# Patient Record
Sex: Female | Born: 1960 | Race: White | Hispanic: No | Marital: Married | Smoking: Former smoker
Health system: Southern US, Community
[De-identification: ages and names within clinical notes are randomized; demographics above are authoritative.]

## PROBLEM LIST (undated history)

## (undated) DIAGNOSIS — J45909 Unspecified asthma, uncomplicated: Secondary | ICD-10-CM

## (undated) DIAGNOSIS — J302 Other seasonal allergic rhinitis: Secondary | ICD-10-CM

## (undated) DIAGNOSIS — E079 Disorder of thyroid, unspecified: Secondary | ICD-10-CM

## (undated) DIAGNOSIS — T7840XA Allergy, unspecified, initial encounter: Secondary | ICD-10-CM

## (undated) DIAGNOSIS — M1990 Unspecified osteoarthritis, unspecified site: Secondary | ICD-10-CM

## (undated) HISTORY — DX: Unspecified asthma, uncomplicated: J45.909

## (undated) HISTORY — DX: Other seasonal allergic rhinitis: J30.2

## (undated) HISTORY — DX: Disorder of thyroid, unspecified: E07.9

## (undated) HISTORY — PX: TUBAL LIGATION: SHX77

## (undated) HISTORY — DX: Allergy, unspecified, initial encounter: T78.40XA

## (undated) HISTORY — DX: Unspecified osteoarthritis, unspecified site: M19.90

---
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
## 1984-03-03 DIAGNOSIS — Z5189 Encounter for other specified aftercare: Secondary | ICD-10-CM

## 1984-03-03 HISTORY — DX: Encounter for other specified aftercare: Z51.89

## 2013-11-11 ENCOUNTER — Ambulatory Visit: Payer: Self-pay | Admitting: Nurse Practitioner

## 2014-05-16 ENCOUNTER — Ambulatory Visit: Payer: Self-pay | Admitting: Nurse Practitioner

## 2014-05-17 ENCOUNTER — Ambulatory Visit: Payer: Self-pay | Admitting: Nurse Practitioner

## 2014-05-17 HISTORY — PX: BREAST CYST ASPIRATION: SHX578

## 2014-07-26 ENCOUNTER — Ambulatory Visit: Payer: Managed Care, Other (non HMO) | Attending: Nurse Practitioner | Admitting: Physical Therapy

## 2014-07-26 ENCOUNTER — Encounter: Payer: Self-pay | Admitting: Physical Therapy

## 2014-07-26 DIAGNOSIS — M791 Myalgia, unspecified site: Secondary | ICD-10-CM

## 2014-07-26 DIAGNOSIS — R278 Other lack of coordination: Secondary | ICD-10-CM | POA: Insufficient documentation

## 2014-07-26 DIAGNOSIS — S39001S Unspecified injury of muscle, fascia and tendon of abdomen, sequela: Secondary | ICD-10-CM | POA: Insufficient documentation

## 2014-07-26 DIAGNOSIS — X58XXXS Exposure to other specified factors, sequela: Secondary | ICD-10-CM | POA: Diagnosis not present

## 2014-07-26 NOTE — Therapy (Signed)
[place] MAIN [facility] SERVICES [address] [place], [place], [address] Phone: [phone]   Fax:  [phone]  Physical Therapy Evaluation  Patient Details  Name: Shalayah Beagley MRN: [id number] Date of Birth: February 18, 1961 Referring Provider:  Josie Saunders, NP  Encounter Date: 07/26/2014      PT End of Session - 07/26/14 1010    Visit Number 1   Number of Visits 12   Date for PT Re-Evaluation 10/19/14   PT Start Time 1010   PT Stop Time 1116   PT Time Calculation (min) 66 min   Activity Tolerance Patient tolerated treatment well;No increased pain   Behavior During Therapy [facility] for tasks assessed/performed      Past Medical History  Diagnosis Date  . Allergy     seasonal (spring and fall)     Past Surgical History  Procedure Laterality Date  . Cesarean section      two 1986, 1992  . "skin spot" removed on scalp -unknown status of cancerous cells  1970  . Tubal ligation      occured with last C-section 1992    There were no vitals filed for this visit.  Visit Diagnosis:  Unspecified injury of muscle, fascia and tendon of abdomen, sequela  Abnormal coordination  Myalgia      Subjective Assessment - 07/26/14 1020    Subjective 1) SUI since 54 yo ago. Worst with coughing, sneezing. Denied with jumping, nor wearing pads. 2) dyspareunia with penetration   Pertinent History Hx of 2 C-section 1986 (pre-eclampsia), 1992 (12 hours of labor). 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 Chiropractic adjustments helped but PT did not. Pt does not do these exercises currently and only does them if she feels a twinge.             [facility] PT Assessment - 07/26/14 1037    Assessment   Medical Diagnosis SUI   Precautions   Precautions None   Restrictions   Weight Bearing Restrictions No   Balance Screen   Has the patient fallen in the past 6 months No   [place]  residence   Home Layout Two level   Prior Function   Level of Independence Independent   Observation/Other Assessments   Other Surveys  Other Surveys  29.2% UDI-6    Functional Tests   Functional tests Other  single leg lift (fitness routine). w/. pelvic instability   Other:   Other/ Comments mild lumbopelvic stability w/ education to coordinate exhalation   Other:   Other/Comments diaphragmatic breathing noted but lacked rib flare    AROM   Overall AROM  Within functional limits for tasks performed;Other (comment)  increased T-spine tension musculature, restricted rotation   Overall AROM Comments PROM hip IR in supine 90/90 position: L 25 deg, R 20 deg   Palpation   SI assessment  noted R AIIS more abteruir > L. Will assess for sacral torsion at next session   Palpation comment severe scar immobility longitudinal and horizontal scar on LQ of abdomen.   adipose crease noted in LQ abdomen   Bed Mobility   Bed Mobility --  noted log rolling                  Pelvic Floor Special Questions - 07/26/14 1124    Perineal Body/Introitus  Normal   Pelvic Floor Internal Exam pt consented    Exam  Type Vaginal   Strength Flicker  cue for stop gas=2/5 posterior> ant, cue for exhalation=3/5   Tone Increased mm tension 2nd layer bil, tenderness on R > L obt int                  PT Education - 07/26/14 1128    Education provided Yes   Education Details HEP, POC, anatomy/ physiology, progression of exercise, modification of fitness routine to  minimize injuries   Person(s) Educated Patient   Methods Explanation;Demonstration;Tactile cues;Verbal cues;Handout   Comprehension Verbalized understanding;Returned demonstration;Verbal cues required          PT Short Term Goals - 07/26/14 1138    PT SHORT TERM GOAL #1   Title Pt will demo increased hip IR on R from 20 deg to 25 deg in order to achieve sexual positions.    Time 6   Period Weeks   Status New   PT SHORT  TERM GOAL #2   Title Pt will demo improved pelvic floor grade from 3/5 to 4/5 in order to advance to deep core strengthening to minimize load on pelvic floor during fitness routine.    Time 6   Period Weeks   Status New           PT Long Term Goals - 07/26/14 1150    PT LONG TERM GOAL #1   Title Pt will demo increased scar mobility in LQ of abdomen in order to improve dee cpre mm function for decreasing leakage and pain with intercourse.    Time 12   Period Weeks   Status New   PT LONG TERM GOAL #2   Title Pt will report decreased pain with intercourse by 75% across two occasions in order to improve QOL.    Time 12   Period Weeks   Status New   PT LONG TERM GOAL #3   Title Pt will report a decrease in UDI-6 from 29% to < 19% in order to demo improve urogenital function.    Time 12   Period Weeks   Status New               Plan - 07/26/14 1130    Clinical Impression Statement Pt is a 54 yo female who c/o SUI and dyspareunia.    Pt will benefit from skilled therapeutic intervention in order to improve on the following deficits Decreased endurance;Hypomobility;Decreased scar mobility;Pain;Increased fascial restricitons;Decreased strength;Decreased mobility;Increased muscle spasms;Decreased coordination;Postural dysfunction;Improper body mechanics;Decreased range of motion;Decreased activity tolerance   Rehab Potential Good   Clinical Impairments Affecting Rehab Potential limited mobility of abdominal scars, limited hip ROM, increased mm tensions in pelvic floor and posterior back, pelvic obliquities,  downward forces of pelvic floor applied during fitnesss routine,  Hx of multiple births and abdominal surgical scars   PT Frequency 1x / week   PT Duration 12 weeks   PT Treatment/Interventions ADLs/Self Care Home Management;Neuromuscular re-education;Scar mobilization;Aquatic Therapy;Biofeedback;Cryotherapy;Therapeutic exercise;Moist Heat;Manual techniques;Therapeutic  activities;Functional mobility training;Dry needling;Gait training;Patient/family education   PT Next Visit Plan scar/abdominal massage, spinal assessment   Consulted and Agree with Plan of Care Patient         Problem List There are no active problems to display for this patient.   Jerl Mina ,PT, DPT, E-RYT  07/26/2014, 12:00 PM  [place] MAIN [facility] SERVICES [address] [place], [place], [address] Phone: [phone]   Fax:  [phone]

## 2014-07-26 NOTE — Patient Instructions (Addendum)
Scar massage horizontal scar with proper hand hold technique (gliding) when laying down Single leg lift modification in fitness classes with exhale  Cough with pelvic floor contraction Reverse kegel with breathing awareness  (Handout)

## 2014-08-02 ENCOUNTER — Ambulatory Visit: Payer: Managed Care, Other (non HMO) | Attending: Nurse Practitioner | Admitting: Physical Therapy

## 2014-08-02 DIAGNOSIS — M791 Myalgia, unspecified site: Secondary | ICD-10-CM

## 2014-08-02 DIAGNOSIS — S39001S Unspecified injury of muscle, fascia and tendon of abdomen, sequela: Secondary | ICD-10-CM | POA: Insufficient documentation

## 2014-08-02 DIAGNOSIS — R278 Other lack of coordination: Secondary | ICD-10-CM | POA: Insufficient documentation

## 2014-08-02 DIAGNOSIS — X58XXXS Exposure to other specified factors, sequela: Secondary | ICD-10-CM | POA: Insufficient documentation

## 2014-08-02 NOTE — Patient Instructions (Addendum)
__Handout with: A.customized modifications in fitness routines (sketched out; 3 way childs pose, quadriped plank on elbows, deep core marching, low cobra) (See treatment section) Cues provided for increased t-spine extension, deep core engaged B. Standing/ double kneeling posture with pelvic neutral and thorax over pelvis.  C. Abductor pollicis longus stretch

## 2014-08-02 NOTE — Therapy (Signed)
[place] MAIN [facility] SERVICES [address] [place], [place], [address] Phone: [phone]   Fax:  [phone]  Physical Therapy Treatment  Patient Details  Name: Jenna Baldwin MRN: [id number] Date of Birth: October 27, 1960 Referring Provider:  Josie Saunders, NP  Encounter Date: 08/02/2014      PT End of Session - 08/02/14 1156    Visit Number 2   Number of Visits 12   Date for PT Re-Evaluation 10/19/14   PT Start Time 1007   PT Stop Time 1110   PT Time Calculation (min) 63 min   Activity Tolerance Patient tolerated treatment well;No increased pain   Behavior During Therapy [facility] for tasks assessed/performed      Past Medical History  Diagnosis Date  . Allergy     seasonal (spring and fall)     Past Surgical History  Procedure Laterality Date  . Cesarean section      two 1986, 1992  . "skin spot" removed on scalp -unknown status of cancerous cells  1970  . Tubal ligation      occured with last C-section 1992    There were no vitals filed for this visit.  Visit Diagnosis:  Unspecified injury of muscle, fascia and tendon of abdomen, sequela  Abnormal coordination  Myalgia      Subjective Assessment - 08/02/14 1010    Subjective Pt has reported she is engaging in her pelvic floor with exhalation during gym exercises. Pt reported inability to perform plank on palms due to pain with thumb after working on [employer].   Pertinent History Hx of 2 C-section 1986 (pre-eclampsia), 1992 (12 hours of labor). 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 Chiropractic adjustments helped but PT does not. Pt does not do these exercises currently and only does them if she feels a twinge.                          [facility] Adult PT Treatment/Exercise - 08/02/14 1036    Posture/Postural Control   Posture/Postural Control Postural limitations   Postural Limitations Increased thoracic kyphosis;Anterior  pelvic tilt   Posture Comments tactile/verbal cues in double kneeling, standing   Pt able to realign thorax/pelvis w/ pelvic breathing applied   Exercises   Exercises Lumbar   Lumbar Exercises: Supine   Other Supine Lumbar Exercises Noted increased extensor mm compensation w/ double leg lifts   modified to deep core marching, cue for lumbopelvic stabili    Lumbar Exercises: Prone   Other Prone Lumbar Exercises excessive lumbar lordosis with self-selected high cobra, upper trap overuse  modified to low cobra   Other Prone Lumbar Exercises modified childs pose -3 way  optimizeparaspinal &  intercostals   Lumbar Exercises: Quadruped   Plank noted excessive lumbar lordosis   modified onto elbows w/ improved form   Modalities   Modalities Moist Heat  5 min, skin intact post-Tx   Manual Therapy   Manual Therapy Joint mobilization;Soft tissue mobilization;Scapular mobilization  Noted increased bony and soft tissue mobility post-Tx   Manual therapy comments Noted increased hypomobility at T3-L1, mm tension on paraspinals bil, medial scapular mm. Noted wrist ext limited by trigger point @ abductor pollicis (limiting ability for hand position in plank)   R sacral torsion, tenderness on PSIS L.   Joint Mobilization Grade III PA on SP T3-L1 (T3, 10 > others), Grade I costovertebral joints   Long axis  distraction on R, PA mobs on R lat border.    Soft tissue mobilization STM paraspinals, trigger point release on Abductor pollicis longus    Scapular Mobilization myofascial glide medial scap L                 PT Education - 08/02/14 1155    Education provided Yes   Education Details HEP, anatomy with visual images of muscles, principle of alignment, importance of modifications in fitness class routine to preserve longterm function of pelvic floor   Person(s) Educated Patient   Methods Explanation;Demonstration;Tactile cues;Verbal cues;Handout   Comprehension Verbalized  understanding;Returned demonstration          PT Short Term Goals - 07/26/14 1138    PT SHORT TERM GOAL #1   Title Pt will demo increased hip IR on R from 20 deg to 25 deg in order to achieve sexual positions.    Time 6   Period Weeks   Status New   PT SHORT TERM GOAL #2   Title Pt will demo improved pelvic floor grade from 3/5 to 4/5 in order to advance to deep core strengthening to minimize load on pelvic floor during fitness routine.    Time 6   Period Weeks   Status New           PT Long Term Goals - 07/26/14 1150    PT LONG TERM GOAL #1   Title Pt will demo increased scar mobility in LQ of abdomen in order to improve dee cpre mm function for decreasing leakage and pain with intercourse.    Time 12   Period Weeks   Status New   PT LONG TERM GOAL #2   Title Pt will report decreased pain with intercourse by 75% across two occasions in order to improve QOL.    Time 12   Period Weeks   Status New   PT LONG TERM GOAL #3   Title Pt will report a decrease in UDI-6 from 29% to < 19% in order to demo improve urogenital function.    Time 12   Period Weeks   Status New               Plan - 08/02/14 1157    Clinical Impression Statement Suspect pt's right-sided pelvic floor mm tension evident in last session may be due to R sacral torsion and improper form with fitness exercises.  Addressed pt's pelvic obliquities and increased T-spine mm tensions/ hypomobility today with manual Tx and modifications to her fitness routine. Pt gained awareness of breathing to pelvic floor mm and ways to minimize thoracic load in fitness exercises  w/ neuro-reedu and  postural re-education . Pt continues to b enefit from skilled PT.    Pt will benefit from skilled therapeutic intervention in order to improve on the following deficits Decreased endurance;Hypomobility;Decreased scar mobility;Pain;Increased fascial restricitons;Decreased strength;Decreased mobility;Increased muscle  spasms;Decreased coordination;Postural dysfunction;Improper body mechanics;Decreased range of motion;Decreased activity tolerance   Rehab Potential Good   Clinical Impairments Affecting Rehab Potential limited mobility of abdominal scars, limited hip ROM, increased mm tensions in pelvic floor and posterior back, pelvic obliquities,  downward forces of pelvic floor applied during fitnesss routine,  Hx of multiple births and abdominal surgerical scars   PT Frequency 1x / week   PT Duration 12 weeks   PT Treatment/Interventions ADLs/Self Care Home Management;Neuromuscular re-education;Scar mobilization;Aquatic Therapy;Biofeedback;Cryotherapy;Therapeutic exercise;Moist Heat;Manual techniques;Therapeutic activities;Functional mobility training;Dry needling;Gait training;Patient/family education   PT Next Visit Plan scar/abdominal massage  Consulted and Agree with Plan of Care Patient        Problem List There are no active problems to display for this patient.   Jerl Mina ,PT, DPT, E-RYT  08/02/2014, 12:04 PM  [place] MAIN [facility] SERVICES [address] [place], [place], [address] Phone: [phone]   Fax:  [phone]

## 2014-08-08 ENCOUNTER — Encounter: Payer: Managed Care, Other (non HMO) | Admitting: Physical Therapy

## 2014-08-16 ENCOUNTER — Ambulatory Visit: Payer: Managed Care, Other (non HMO) | Admitting: Physical Therapy

## 2014-08-16 DIAGNOSIS — M791 Myalgia, unspecified site: Secondary | ICD-10-CM

## 2014-08-16 DIAGNOSIS — S39001S Unspecified injury of muscle, fascia and tendon of abdomen, sequela: Secondary | ICD-10-CM

## 2014-08-16 DIAGNOSIS — R278 Other lack of coordination: Secondary | ICD-10-CM

## 2014-08-16 NOTE — Patient Instructions (Signed)
You are now ready to begin training the deep core muscles system: diaphragm, transverse abdominis, pelvic floor . These muscles must work together as a team.           The key to these exercises to train the brain to coordinate the timing of these muscles and to have them turn on for long periods of time to hold you upright against gravity (especially important if you are on your feet all day).These muscles are postural muscles and play a role stabilizing your spine and bodyweight. By doing these repetitions slowly and correctly instead of doing crunches, you will achieve a flatter belly without a lower pooch. You are also placing your spine in a more neutral position and breathing properly which in turn, decreases your risk for problems related to your pelvic floor, abdominal, and low back such as pelvic organ prolapse, hernias, diastasis recti (separation of superficial muscles), disk herniations, spinal fractures. These exercises set a solid foundation for you to later progress to resistance/ strength training with therabands and weights and return to other typical fitness exercises with a stronger deeper core.     Perform only Level 1-2 this week.  Added Quick Pelvic floor contraction Level 1     Scar massage this week  Prone on elbow and low cobra refinement cues

## 2014-08-16 NOTE — Therapy (Signed)
[place] MAIN [facility] SERVICES [address] [place], [place], [address] Phone: [phone]   Fax:  [phone]  Physical Therapy Treatment  Patient Details  Name: Jenna Baldwin MRN: [id number] Date of Birth: 12-07-1960 Referring Provider:  Josie Saunders, NP  Encounter Date: 08/16/2014      PT End of Session - 08/16/14 1119    Visit Number 3   Number of Visits 12   Date for PT Re-Evaluation 10/19/14   PT Start Time 1005   PT Stop Time 1055   PT Time Calculation (min) 50 min   Activity Tolerance Patient tolerated treatment well;No increased pain   Behavior During Therapy [facility] for tasks assessed/performed      Past Medical History  Diagnosis Date  . Allergy     seasonal (spring and fall)     Past Surgical History  Procedure Laterality Date  . Cesarean section      two 1986, 1992  . "skin spot" removed on scalp -unknown status of cancerous cells  1970  . Tubal ligation      occured with last C-section 1992    There were no vitals filed for this visit.  Visit Diagnosis:  Unspecified injury of muscle, fascia and tendon of abdomen, sequela  Abnormal coordination  Myalgia      Subjective Assessment - 08/16/14 1014    Subjective Pt has been using the modifications to gym exercises and HEP. Pt continues to modify her other exercises due to her R thumb pain. Pt did not perform her scar massage this past week.    Pertinent History Hx of 2 C-section 1986 (pre-eclampsia), 1992 (12 hours of labor). 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 Chiropractic adjustments helped but PT does not. Pt does not do these exercises currently and only does them if she feels a twinge.             [facility] PT Assessment - 08/16/14 1112    Assessment   Medical Diagnosis SUI   Precautions   Precautions None   Restrictions   Weight Bearing Restrictions No   Home Environment   Living Environment Private residence   Home Layout Two level   Prior Function   Level of Independence Independent   Observation/Other Assessments   Other Surveys  Other Surveys  29.2% UDI-6    Functional Tests   Functional tests Other  single leg lift (fitness routine). w/. pelvic instability   Other:   Other/ Comments --   Other:   Other/Comments --   AROM   Overall AROM  --  increased T-spine tension musculature, restricted rotation   Overall AROM Comments --   Palpation   SI assessment  --   Palpation comment --  less t-spine mm tensions, spinal mobility PAVM increased    Bed Mobility   Bed Mobility --  noted log rolling                      [facility] Adult PT Treatment/Exercise - 08/16/14 1112    Posture/Postural Control   Posture/Postural Control Postural limitations   Postural Limitations --  able to self correct to neutral in hooklying    Posture Comments hooklying marching w/ extensor, downgraded to Dynamic Stabilization 1-2  added pelvicf loor contraction (quick) Level 1   Exercises   Exercises Other Exercises   Lumbar Exercises: Supine   Other Supine Lumbar Exercises --   Lumbar Exercises:  Prone   Other Prone Lumbar Exercises improved cobra but requried cuing for elbow wrist alignment for increased WBing and T-spine ext not at lumbar    Other Prone Lumbar Exercises --   Lumbar Exercises: Quadruped   Plank improved prone on elbow plank  w/o lumbar lordosis, decreased LE activation  cue/ demo for increased ankle DF and scap downward rotation   Modalities   Modalities Moist Heat  5 min, skin intact post-Tx   Manual Therapy   Manual Therapy Joint mobilization;Soft tissue mobilization;Scapular mobilization  Noted increased bony and soft tissue mobility post-Tx   Manual therapy comments Noted increased hypomobility at T3-L1, mm tension on paraspinals bil, medial scapular mm. Noted wrist ext limited by trigger point @ abductor pollicis (limiting ability for hand position in plank)   R sacral  torsion, tenderness on PSIS L.   Joint Mobilization Grade III PA on SP T3-L1 (T3, 10 > others), Grade I costovertebral joints   Long axis distraction on R, PA mobs on R lat border.    Soft tissue mobilization STM paraspinals, trigger point release on Abductor pollicis longus    Scapular Mobilization myofascial glide medial scap L                 PT Education - 08/16/14 1120    Education provided Yes   Education Details HEP, and educated if thumb does not improve by next week, recommended pt to seek an OT evaluation    Person(s) Educated Patient   Methods Explanation;Demonstration;Tactile cues;Verbal cues;Handout   Comprehension Verbalized understanding;Returned demonstration          PT Short Term Goals - 07/26/14 1138    PT SHORT TERM GOAL #1   Title Pt will demo increased hip IR on R from 20 deg to 25 deg in order to achieve sexual positions.    Time 6   Period Weeks   Status New   PT SHORT TERM GOAL #2   Title Pt will demo improved pelvic floor grade from 3/5 to 4/5 in order to advance to deep core strengthening to minimize load on pelvic floor during fitness routine.    Time 6   Period Weeks   Status New           PT Long Term Goals - 07/26/14 1150    PT LONG TERM GOAL #1   Title Pt will demo increased scar mobility in LQ of abdomen in order to improve dee cpre mm function for decreasing leakage and pain with intercourse.    Time 12   Period Weeks   Status New   PT LONG TERM GOAL #2   Title Pt will report decreased pain with intercourse by 75% across two occasions in order to improve QOL.    Time 12   Period Weeks   Status New   PT LONG TERM GOAL #3   Title Pt will report a decrease in UDI-6 from 29% to < 19% in order to demo improve urogenital function.    Time 12   Period Weeks   Status New               Plan - 08/16/14 1121    Clinical Impression Statement Pt demo'd improved propioception awareness with pelvic/spinal alignment and  modifications to her exercises, decreased midback mm tensions compared to last session. Initiated dynamic stabilization 1-2 because pt continued to show faulty movement patterns with hooklying marching. [employer] quick pelvic floor contraction with Level 1.  Pt will continue  to benefit from skilled PT.    Pt will benefit from skilled therapeutic intervention in order to improve on the following deficits Decreased endurance;Hypomobility;Decreased scar mobility;Pain;Increased fascial restricitons;Decreased strength;Decreased mobility;Increased muscle spasms;Decreased coordination;Postural dysfunction;Improper body mechanics;Decreased range of motion;Decreased activity tolerance   Rehab Potential Good   Clinical Impairments Affecting Rehab Potential limited mobility of abdominal scars, limited hip ROM, increased mm tensions in pelvic floor and posterior back, pelvic obliquities,  downward forces of pelvic floor applied during fitnesss routine,  Hx of multiple births and abdominal surgerical scars   PT Frequency 1x / week   PT Duration 12 weeks   PT Treatment/Interventions ADLs/Self Care Home Management;Neuromuscular re-education;Scar mobilization;Aquatic Therapy;Biofeedback;Cryotherapy;Therapeutic exercise;Moist Heat;Manual techniques;Therapeutic activities;Functional mobility training;Dry needling;Gait training;Patient/family education   PT Next Visit Plan scar/abdominal massage, reassess pelvic floro contraction, and sacral alignment   Consulted and Agree with Plan of Care Patient        Problem List There are no active problems to display for this patient.   Jerl Mina ,PT, DPT, E-RYT  08/16/2014, 11:28 AM  [place] MAIN [facility] SERVICES [address] [place], [place], [address] Phone: [phone]   Fax:  [phone]

## 2014-08-22 ENCOUNTER — Encounter: Payer: Managed Care, Other (non HMO) | Admitting: Physical Therapy

## 2014-08-30 ENCOUNTER — Encounter: Payer: Managed Care, Other (non HMO) | Admitting: Physical Therapy

## 2014-09-13 ENCOUNTER — Encounter: Payer: Managed Care, Other (non HMO) | Admitting: Physical Therapy

## 2015-08-01 ENCOUNTER — Other Ambulatory Visit: Payer: Self-pay | Admitting: Internal Medicine

## 2015-08-01 DIAGNOSIS — Z1231 Encounter for screening mammogram for malignant neoplasm of breast: Secondary | ICD-10-CM

## 2015-08-01 DIAGNOSIS — N6009 Solitary cyst of unspecified breast: Secondary | ICD-10-CM

## 2015-08-13 ENCOUNTER — Ambulatory Visit
Admission: RE | Admit: 2015-08-13 | Discharge: 2015-08-13 | Disposition: A | Discharge status: Home / Self Care | Payer: Managed Care, Other (non HMO) | Source: Ambulatory Visit | Attending: Internal Medicine | Admitting: Internal Medicine

## 2015-08-13 DIAGNOSIS — Z1231 Encounter for screening mammogram for malignant neoplasm of breast: Secondary | ICD-10-CM

## 2015-08-13 DIAGNOSIS — N6009 Solitary cyst of unspecified breast: Secondary | ICD-10-CM

## 2015-08-13 DIAGNOSIS — R928 Other abnormal and inconclusive findings on diagnostic imaging of breast: Secondary | ICD-10-CM | POA: Diagnosis not present

## 2015-08-13 DIAGNOSIS — N6001 Solitary cyst of right breast: Secondary | ICD-10-CM | POA: Diagnosis present

## 2015-11-05 IMAGING — US US BREAST*R* LIMITED INC AXILLA
1 series · 9 of 9 positions shown · non-contrast
Comparison: Priors

CLINICAL DATA: Patient for short-term followup right breast mass.

EXAM:
DIGITAL DIAGNOSTIC RIGHT MAMMOGRAM WITH 3D TOMOSYNTHESIS WITH CAD
ULTRASOUND RIGHT BREAST

[Series 1: us breast*right* limited inc axilla · 0.09mm/px · 9 of 9 slices shown]
[im 1/9]
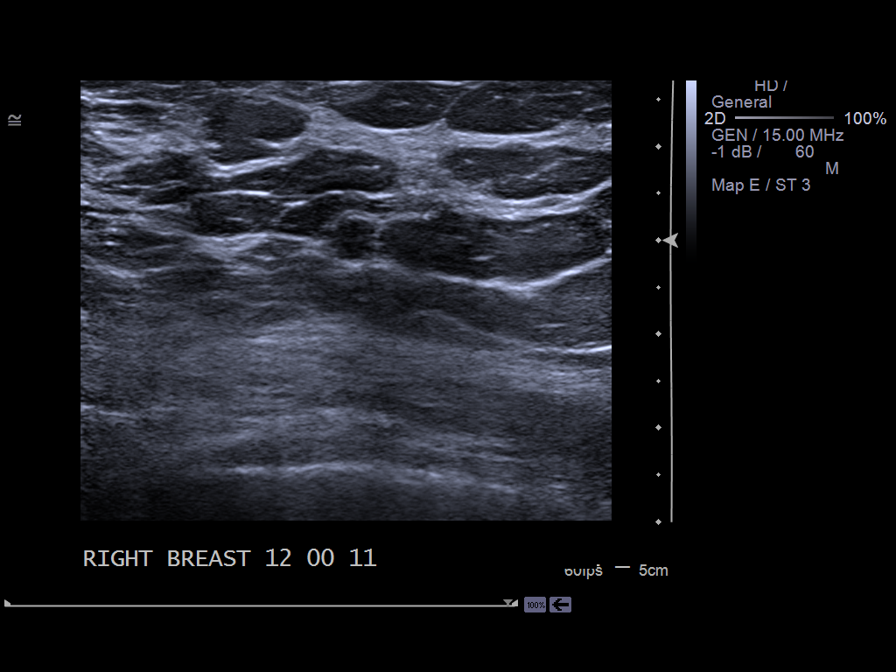
[im 2/9]
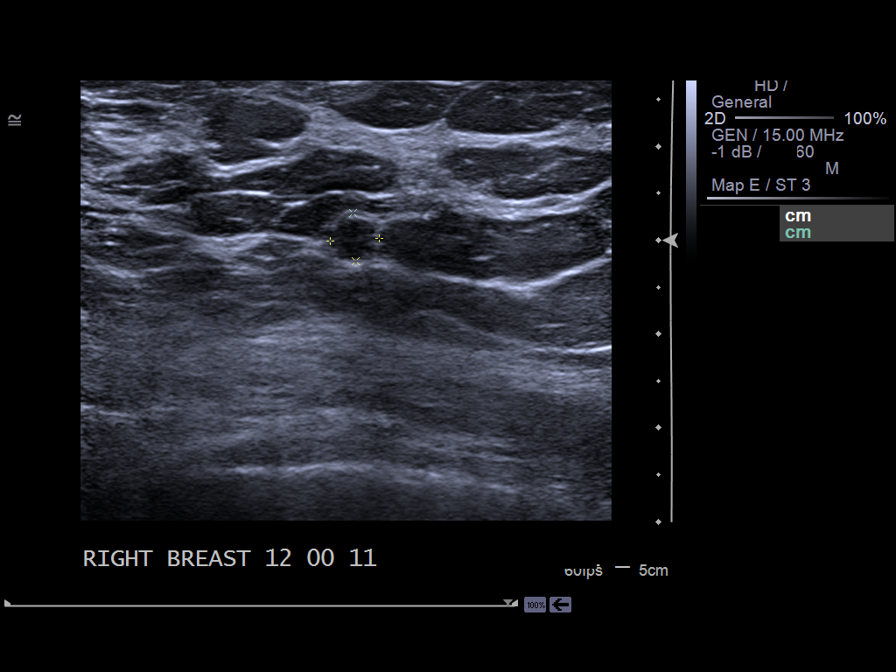
[im 3/9]
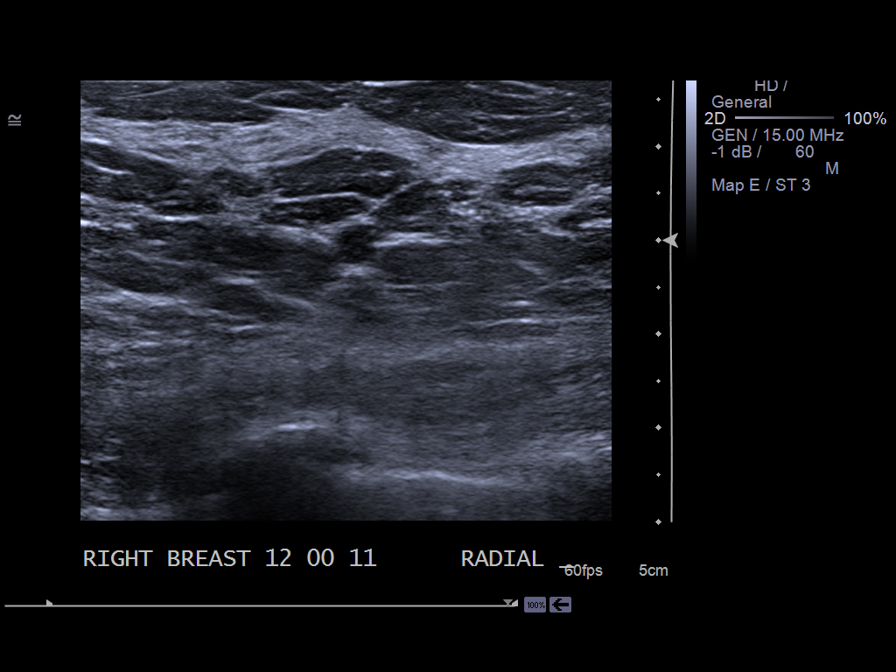
[im 4/9]
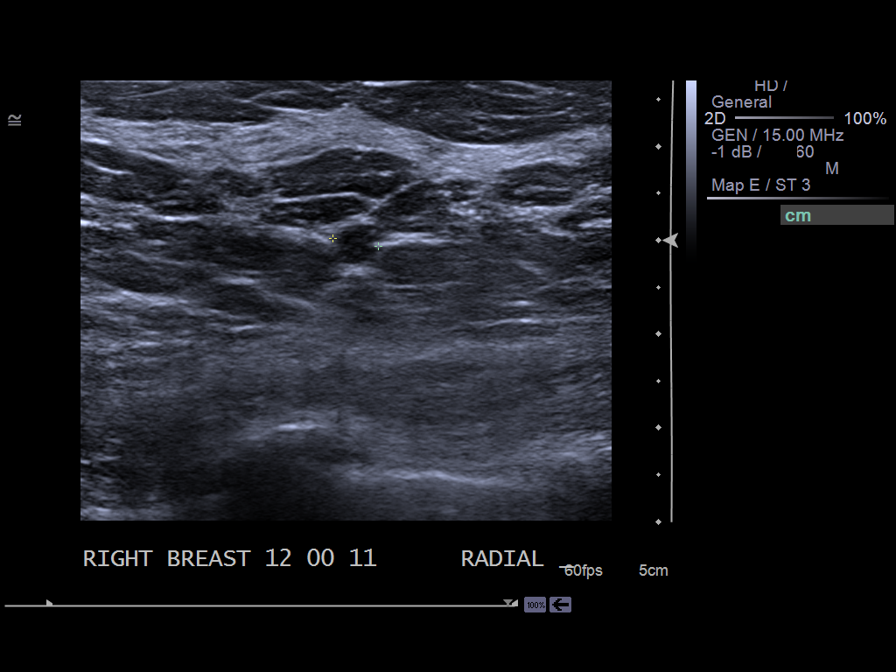
[im 5/9]
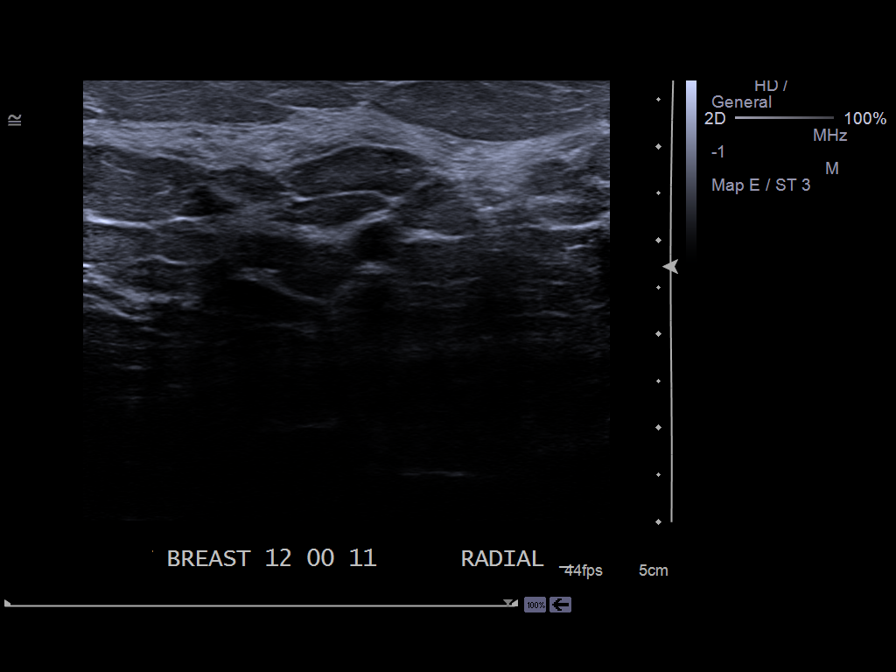
[im 6/9]
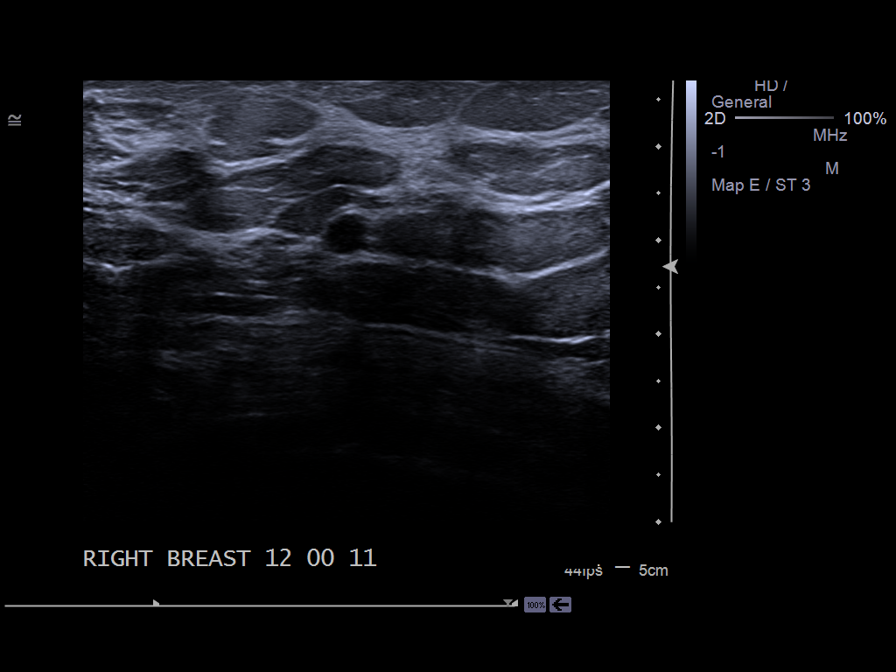
[im 7/9]
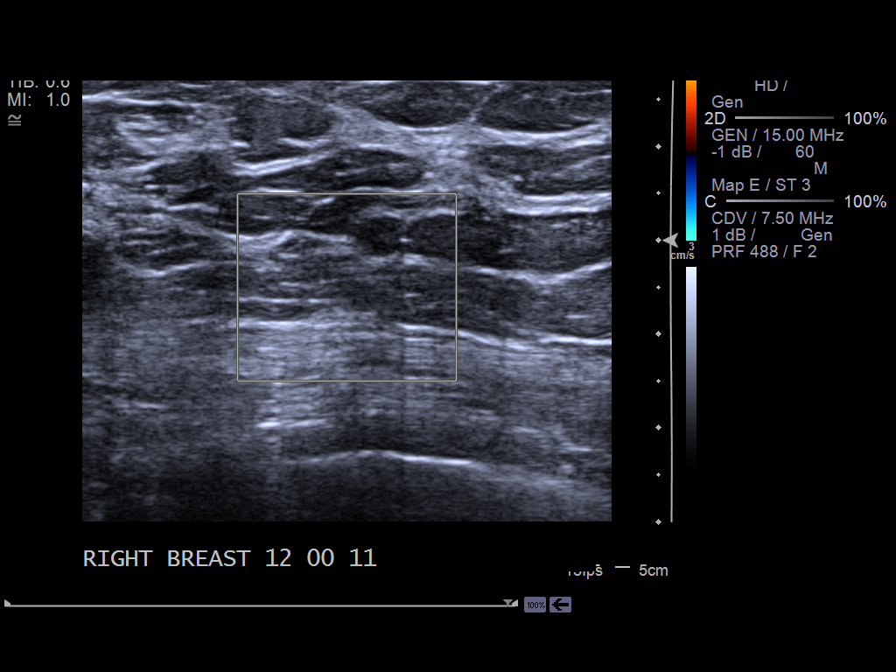
[im 8/9]
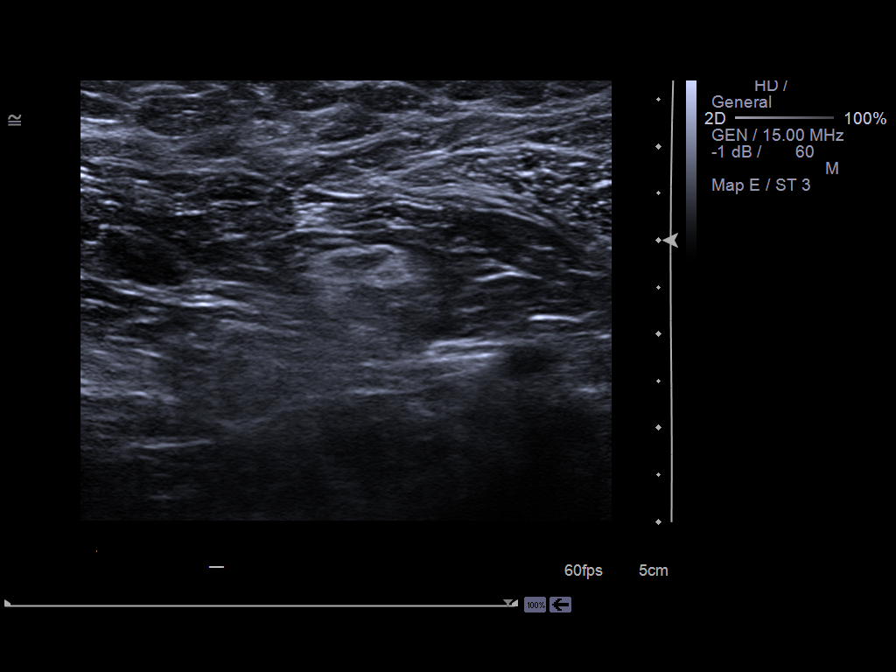
[im 9/9]
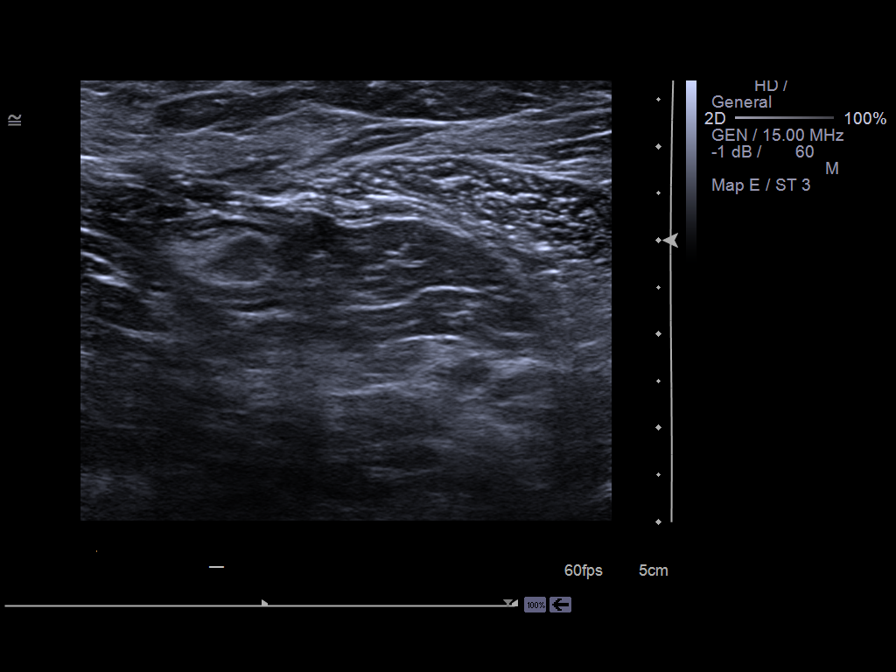

[9 of 9 positions shown; findings below may reference images not displayed]

ACR Breast Density Category c: The breast tissue is heterogeneously
dense, which may obscure small masses.
FINDINGS: Within the 12 o'clock position right breast posterior depth there is
a partially obscured lobular 7 mm oval mass, not significantly
changed in size when compared to prior examination.

Mammographic images were processed with CAD.

On physical exam, I palpate no discrete mass within the 12 o'clock
position right breast.

Targeted ultrasound is performed, showing a microlobular round 5 x 5
x 5 mm hypoechoic mass within the right breast 12 o'clock position
11 cm from the nipple. No right axillary adenopathy.
IMPRESSION: Indeterminate lobular hypoechoic round mass within the right breast
12 o'clock position. This may potentially represent a complicated
cyst however solid mass is not excluded.

RECOMMENDATION:
Attempt at ultrasound-guided aspiration with the potential for
biopsy of indeterminate right breast mass.

This will be scheduled at the patient's convenience.

I have discussed the findings and recommendations with the patient.
Results were also provided in writing at the conclusion of the
visit. If applicable, a reminder letter will be sent to the patient
regarding the next appointment.

BI-RADS CATEGORY  4: Suspicious.

## 2016-09-04 ENCOUNTER — Other Ambulatory Visit: Payer: Self-pay | Admitting: Internal Medicine

## 2016-09-04 DIAGNOSIS — Z1231 Encounter for screening mammogram for malignant neoplasm of breast: Secondary | ICD-10-CM

## 2016-09-25 ENCOUNTER — Ambulatory Visit
Admission: RE | Admit: 2016-09-25 | Discharge: 2016-09-25 | Disposition: A | Discharge status: Home / Self Care | Payer: Commercial Managed Care - PPO | Source: Ambulatory Visit | Attending: Internal Medicine | Admitting: Internal Medicine

## 2016-09-25 ENCOUNTER — Encounter: Payer: Self-pay | Admitting: Radiology

## 2016-09-25 DIAGNOSIS — Z1231 Encounter for screening mammogram for malignant neoplasm of breast: Secondary | ICD-10-CM

## 2016-10-07 ENCOUNTER — Ambulatory Visit: Payer: Managed Care, Other (non HMO) | Admitting: Internal Medicine

## 2016-10-09 ENCOUNTER — Ambulatory Visit: Payer: Managed Care, Other (non HMO) | Admitting: Family Medicine

## 2019-05-27 ENCOUNTER — Ambulatory Visit: Payer: Commercial Managed Care - PPO | Attending: Internal Medicine

## 2019-05-27 DIAGNOSIS — Z23 Encounter for immunization: Secondary | ICD-10-CM

## 2019-05-27 NOTE — Progress Notes (Signed)
   Covid-19 Vaccination Clinic  Name:  Sheritta Fiest    MRN: [id number] DOB: 11-12-1960  05/27/2019  Ms. Pulgarin was observed post Covid-19 immunization for 15 minutes without incident. She was provided with Vaccine Information Sheet and instruction to access the V-Safe system.   Ms. Leino was instructed to call 911 with any severe reactions post vaccine: Marland Kitchen Difficulty breathing  . Swelling of face and throat  . A fast heartbeat  . A bad rash all over body  . Dizziness and weakness   Immunizations Administered    Name Date Dose VIS Date Route   Pfizer COVID-19 Vaccine 05/27/2019  4:09 PM 0.3 mL 02/11/2019 Intramuscular   Manufacturer: [place]   Lot: [id number]   [place]: [id number]

## 2019-07-04 ENCOUNTER — Ambulatory Visit: Payer: Commercial Managed Care - PPO

## 2019-07-06 ENCOUNTER — Ambulatory Visit: Payer: Commercial Managed Care - PPO | Attending: Internal Medicine

## 2019-07-06 DIAGNOSIS — Z23 Encounter for immunization: Secondary | ICD-10-CM

## 2019-07-06 NOTE — Progress Notes (Signed)
   Covid-19 Vaccination Clinic  Name:  Helaine Burfield    MRN: [id number] DOB: [birth year]  07/06/2019  Ms. Menasco was observed post Covid-19 immunization for 15 minutes without incident. She was provided with Vaccine Information Sheet and instruction to access the V-Safe system.   Ms. Helmkamp was instructed to call 911 with any severe reactions post vaccine: Marland Kitchen Difficulty breathing  . Swelling of face and throat  . A fast heartbeat  . A bad rash all over body  . Dizziness and weakness   Immunizations Administered    Name Date Dose VIS Date Route   Pfizer COVID-19 Vaccine 07/06/2019  4:48 PM 0.3 mL 04/27/2018 Intramuscular   Manufacturer: [place]   Lot: [id number]   [place]: [id number]

## 2020-10-01 ENCOUNTER — Encounter: Payer: Self-pay | Admitting: Gastroenterology

## 2020-10-22 ENCOUNTER — Other Ambulatory Visit: Payer: Self-pay

## 2020-10-22 ENCOUNTER — Ambulatory Visit (AMBULATORY_SURGERY_CENTER): Payer: Commercial Managed Care - PPO

## 2020-10-22 VITALS — Ht 62.0 in | Wt 146.0 lb

## 2020-10-22 DIAGNOSIS — Z800 Family history of malignant neoplasm of digestive organs: Secondary | ICD-10-CM

## 2020-10-22 DIAGNOSIS — Z1211 Encounter for screening for malignant neoplasm of colon: Secondary | ICD-10-CM

## 2020-10-22 MED ORDER — PEG-KCL-NACL-NASULF-NA ASC-C 100 G PO SOLR: 1.0000 | Freq: Once | ORAL | 0 refills | Status: AC

## 2020-10-22 NOTE — Progress Notes (Signed)
Pre visit completed via phone call; Patient verified name, DOB, and address; No egg or soy allergy known to patient  No issues with past sedation with any surgeries or procedures Patient denies ever being told they had issues or difficulty with intubation  No FH of Malignant Hyperthermia No diet pills per patient No home 02 use per patient  No blood thinners per patient  Pt denies issues with constipation  No A fib or A flutter  EMMI video via MyChart  COVID 19 guidelines implemented in [facility] today with Pt and RN  Pt is fully vaccinated for Covid x 2;  Coupon given to pt in [facility] today and NO PA's for preps discussed with pt In [facility] today  Discussed with pt there will be an out-of-pocket cost for prep and that varies from $0 to 70 dollars   Due to the COVID-19 pandemic we are asking patients to follow certain guidelines.  Pt aware of COVID protocols and [facility] guidelines

## 2020-10-29 ENCOUNTER — Telehealth: Payer: Self-pay | Admitting: Gastroenterology

## 2020-10-29 NOTE — Telephone Encounter (Signed)
PT HAD A LOT OF QUESTIONS ABOUT HER DIET PRIOR TO HER COLON 9-2- MANY QUESTIONS ANSWERED TODAY   ? CAN SHE HAVE PROBIOTIC- YES CAN SHE HAVE HER THYROID MED PRIOR- YES   [email or web address]   PT WANTS MIRALAX PREP DUE TO SODIUM - SHE HAS MEINERS DX

## 2020-10-29 NOTE — Telephone Encounter (Signed)
Inbound call from patient requesting a call from a nurse please.  Has questions in regards to prep diet.

## 2020-10-29 NOTE — Telephone Encounter (Signed)
New Miralax instructions to pts e-mail - pt to call with questions after she receives these

## 2020-10-30 ENCOUNTER — Encounter: Payer: Self-pay | Admitting: Gastroenterology

## 2020-11-02 ENCOUNTER — Other Ambulatory Visit: Payer: Self-pay

## 2020-11-02 ENCOUNTER — Ambulatory Visit (AMBULATORY_SURGERY_CENTER): Payer: BC Managed Care – PPO | Admitting: Gastroenterology

## 2020-11-02 ENCOUNTER — Encounter: Payer: Self-pay | Admitting: Gastroenterology

## 2020-11-02 VITALS — BP 124/70 | HR 59 | Temp 98.0°F | Resp 14 | Ht 62.0 in | Wt 146.0 lb

## 2020-11-02 DIAGNOSIS — D128 Benign neoplasm of rectum: Secondary | ICD-10-CM

## 2020-11-02 DIAGNOSIS — Z1211 Encounter for screening for malignant neoplasm of colon: Secondary | ICD-10-CM

## 2020-11-02 DIAGNOSIS — Z800 Family history of malignant neoplasm of digestive organs: Secondary | ICD-10-CM | POA: Diagnosis not present

## 2020-11-02 DIAGNOSIS — Z8371 Family history of colonic polyps: Secondary | ICD-10-CM

## 2020-11-02 MED ORDER — SODIUM CHLORIDE 0.9 % IV SOLN: 500.0000 mL | Freq: Once | INTRAVENOUS | Status: DC

## 2020-11-02 NOTE — Progress Notes (Addendum)
Referring Provider: Thornton Park, MD Primary Care Physician:  Hayden Rasmussen, MD  Reason for Consultation:  Colon cancer screening   IMPRESSION:  Need for colon cancer screening  PLAN: Colonoscopy in the [place] today   HPI: Jenna Baldwin is a 60 y.o. female presents for screening colonoscopy.  Colonoscopy at age 32 in [place] that was normal.   No baseline GI symptoms.   Paternal grandfather with colon cancer at an advanced age. Father with colon polyps.    Past Medical History:  Diagnosis Date   Arthritis    RIGHT knee and thumb   Asthma    hx of   Blood transfusion without reported diagnosis 1986   Seasonal allergies    Thyroid disease    on meds    Past Surgical History:  Procedure Laterality Date   "skin spot" removed on scalp -unknown status of cancerous cells  03/03/1968   BREAST CYST ASPIRATION Right 05/17/2014   CESAREAN SECTION     1986, 1992   TUBAL LIGATION     occured with last C-section 1992    Current Outpatient Medications  Medication Sig Dispense Refill   TIROSINT 50 MCG CAPS Take 1 capsule by mouth daily.     Bioflavonoid Products (VITAMIN C/BIOFLAVONOIDS PO) Take 1 capsule by mouth 2 (two) times daily.     Cholecalciferol (BIO-D-MULSION FORTE) 50 MCG/0.04ML LIQD Take 1 drop by mouth daily at 6 (six) AM.     COD LIVER OIL/VITAMINS A & D PO Take 1 Dose by mouth 2 (two) times daily.     COLLAGEN PO Take 1 Dose by mouth daily at 6 (six) AM.     magnesium oxide (MAG-OX) 400 MG tablet Take 1 tablet by mouth at bedtime as needed.     Multiple Vitamins-Minerals (ICAPS AREDS 2 PO) Take 1 capsule by mouth 2 (two) times daily.     Current Facility-Administered Medications  Medication Dose Route Frequency Provider Last Rate Last Admin   0.9 %  sodium chloride infusion  500 mL Intravenous Once Thornton Park, MD        Allergies as of 11/02/2020 - Review Complete 11/02/2020  Allergen Reaction Noted   Gluten meal Diarrhea, Rash, and Other (See  Comments) 10/22/2020   Nitrofurantoin Other (See Comments) 07/13/2013   Fluconazole Hives 07/26/2014   Sulfa antibiotics Diarrhea, Nausea And Vomiting, and Other (See Comments) 07/13/2013   Macrodantin [nitrofurantoin macrocrystal] Other (See Comments) 07/26/2014   Levothyroxine sodium Rash and Other (See Comments) 07/04/2019    Family History  Problem Relation Age of Onset   Cancer Mother    Breast cancer Mother 26   Colon polyps Father 25   Cancer Paternal Grandmother    Colon polyps Paternal Grandfather 88   Colon cancer Paternal Grandfather 52   Cancer Paternal Grandfather    Esophageal cancer Neg Hx    Rectal cancer Neg Hx    Stomach cancer Neg Hx      Physical Exam: General:   Alert,  well-nourished, pleasant and cooperative in NAD Head:  Normocephalic and atraumatic. Eyes:  Sclera clear, no icterus.   Conjunctiva pink. Mouth:  No deformity or lesions.   Neck:  Supple; no masses or thyromegaly. Lungs:  Clear throughout to auscultation.   No wheezes. Heart:  Regular rate and rhythm; no murmurs. Abdomen:  Soft, non-tender, nondistended, normal bowel sounds, no rebound or guarding.  Msk:  Symmetrical. No boney deformities LAD: No inguinal or umbilical LAD Extremities:  No clubbing or edema.  Neurologic:  Alert and  oriented x4;  grossly nonfocal Skin:  No obvious rash or bruise. Psych:  Alert and cooperative. Normal mood and affect.     Studies/Results: No results found.    Jenna Baldwin L. Tarri Glenn, MD, MPH 11/02/2020, 8:23 AM

## 2020-11-02 NOTE — Progress Notes (Signed)
Called to room to assist during endoscopic procedure.  Patient ID and intended procedure confirmed with present staff. Received instructions for my participation in the procedure from the performing physician.  

## 2020-11-02 NOTE — Op Note (Signed)
Jenna Baldwin Patient Name: Jenna Baldwin Procedure Date: 11/02/2020 8:17 AM MRN: [id number] Endoscopist: Thornton Park MD, MD Age: 60 Referring MD:  Date of Birth: 1961/01/20 Gender: Female Account #: [id number] Procedure:                Colonoscopy Indications:              Screening for colorectal malignant neoplasm                           Normal colonoscopy in [place] at age 25                           Paternal grandfather with colon cancer at an                            advanced age                           Father with colon polyp Medicines:                Monitored Anesthesia Care Procedure:                Pre-Anesthesia Assessment:                           - Prior to the procedure, a History and Physical                            was performed, and patient medications and                            allergies were reviewed. The patient's tolerance of                            previous anesthesia was also reviewed. The risks                            and benefits of the procedure and the sedation                            options and risks were discussed with the patient.                            All questions were answered, and informed consent                            was obtained. Prior Anticoagulants: The patient has                            taken no previous anticoagulant or antiplatelet                            agents. ASA Grade Assessment: II - A patient with  mild systemic disease. After reviewing the risks                            and benefits, the patient was deemed in                            satisfactory condition to undergo the procedure.                           After obtaining informed consent, the colonoscope                            was passed under direct vision. Throughout the                            procedure, the patient's blood pressure, pulse, and                            oxygen saturations were  monitored continuously. The                            CF [id number] was introduced through the anus                            and advanced to the 3 cm into the ileum. A second                            forward view of the right colon was performed. The                            colonoscopy was performed without difficulty. The                            patient tolerated the procedure well. The quality                            of the bowel preparation was good. The terminal                            ileum, ileocecal valve, appendiceal orifice, and                            rectum were photographed. Scope In: 8:34:47 AM Scope Out: 8:50:01 AM Scope Withdrawal Time: 0 hours 13 minutes 17 seconds  Total Procedure Duration: 0 hours 15 minutes 14 seconds  Findings:                 The perianal and digital rectal examinations were                            normal.                           A few small and large-mouthed diverticula were  found in the sigmoid colon.                           A 3 mm polyp was found in the rectum. The polyp was                            sessile. The polyp was removed with a piecemeal                            technique using a hot snare. Resection and                            retrieval were complete. Estimated blood loss was                            minimal.                           The exam was otherwise without abnormality on                            direct and retroflexion views. Complications:            No immediate complications. Estimated blood loss:                            Minimal. Estimated Blood Loss:     Estimated blood loss was minimal. Impression:               - Diverticulosis in the sigmoid colon.                           - One 3 mm polyp in the rectum, removed piecemeal                            using a hot snare. Resected and retrieved.                           - The examination was otherwise  normal on direct                            and retroflexion views. Recommendation:           - Patient has a contact number available for                            emergencies. The signs and symptoms of potential                            delayed complications were discussed with the                            patient. Return to normal activities tomorrow.                            Written discharge  instructions were provided to the                            patient.                           - High fiber diet.                           - Continue present medications.                           - Await pathology results.                           - Repeat colonoscopy date to be determined after                            pending pathology results are reviewed for                            surveillance.                           - Emerging evidence supports eating a diet of                            fruits, vegetables, grains, calcium, and yogurt                            while reducing red meat and alcohol may reduce the                            risk of colon cancer.                           - Thank you for allowing me to be involved in your                            colon cancer prevention. Thornton Park MD, MD 11/02/2020 8:55:51 AM This report has been signed electronically.

## 2020-11-02 NOTE — Progress Notes (Signed)
Pt's states no medical or surgical changes since previsit or office visit. 

## 2020-11-02 NOTE — Progress Notes (Signed)
A and O x3. Report to RN. Tolerated MAC anesthesia well. 

## 2020-11-02 NOTE — Patient Instructions (Signed)
Thank you for allowing [place] to care for you today! Await pathology results, approximately 2 weeks.  We will mail you results with recommendations for future colonoscopy, most likely 5-7 years. Resume your regular diet and medications today. Return to your normal daily activities tomorrow, 11/03/20  YOU HAD AN ENDOSCOPIC PROCEDURE TODAY AT [place]:   Refer to the procedure report that was given to you for any specific questions about what was found during the examination.  If the procedure report does not answer your questions, please call your gastroenterologist to clarify.  If you requested that your care partner not be given the details of your procedure findings, then the procedure report has been included in a sealed envelope for you to review at your convenience later.  YOU SHOULD EXPECT: Some feelings of bloating in the abdomen. Passage of more gas than usual.  Walking can help get rid of the air that was put into your GI tract during the procedure and reduce the bloating. If you had a lower endoscopy (such as a colonoscopy or flexible sigmoidoscopy) you may notice spotting of blood in your stool or on the toilet paper. If you underwent a bowel prep for your procedure, you may not have a normal bowel movement for a few days.  Please Note:  You might notice some irritation and congestion in your nose or some drainage.  This is from the oxygen used during your procedure.  There is no need for concern and it should clear up in a day or so.  SYMPTOMS TO REPORT IMMEDIATELY:  Following lower endoscopy (colonoscopy or flexible sigmoidoscopy):  Excessive amounts of blood in the stool  Significant tenderness or worsening of abdominal pains  Swelling of the abdomen that is new, acute  Fever of 100F or higher   For urgent or emergent issues, a gastroenterologist can be reached at any hour by calling [phone]. Do not use MyChart messaging for urgent concerns.    DIET:  We do  recommend a small meal at first, but then you may proceed to your regular diet.  Drink plenty of fluids but you should avoid alcoholic beverages for 24 hours.  ACTIVITY:  You should plan to take it easy for the rest of today and you should NOT DRIVE or use heavy machinery until tomorrow (because of the sedation medicines used during the test).    FOLLOW UP: Our staff will call the number listed on your records 48-72 hours following your procedure to check on you and address any questions or concerns that you may have regarding the information given to you following your procedure. If we do not reach you, we will leave a message.  We will attempt to reach you two times.  During this call, we will ask if you have developed any symptoms of COVID 19. If you develop any symptoms (ie: fever, flu-like symptoms, shortness of breath, cough etc.) before then, please call [phone].  If you test positive for Covid 19 in the 2 weeks post procedure, please call and report this information to [place].    If any biopsies were taken you will be contacted by phone or by letter within the next 1-3 weeks.  Please call us at [phone] if you have not heard about the biopsies in 3 weeks.    SIGNATURES/CONFIDENTIALITY: You and/or your care partner have signed paperwork which will be entered into your electronic medical record.  These signatures attest to the fact that that the information  above on your After Visit Summary has been reviewed and is understood.  Full responsibility of the confidentiality of this discharge information lies with you and/or your care-partner.  

## 2020-11-06 ENCOUNTER — Telehealth: Payer: Self-pay

## 2020-11-06 NOTE — Telephone Encounter (Signed)
  Follow up Call-  Call back number 11/02/2020  Post procedure Call Back phone  # [phone]  Permission to leave phone message Yes  Some recent data might be hidden     Patient questions:  Do you have a fever, pain , or abdominal swelling? No. Pain Score  0 *  Have you tolerated food without any problems? Yes.    Have you been able to return to your normal activities? Yes.    Do you have any questions about your discharge instructions: Diet   No. Medications  No. Follow up visit  No.  Do you have questions or concerns about your Care? No.  Actions: * If pain score is 4 or above: No action needed, pain <4.   Have you developed a fever since your procedure? no  2.   Have you had an respiratory symptoms (SOB or cough) since your procedure? no  3.   Have you tested positive for COVID 19 since your procedure no  4.   Have you had any family members/close contacts diagnosed with the COVID 19 since your procedure?  no   If yes to any of these questions please route to Joylene John, RN and Joella Prince, RN

## 2020-11-11 ENCOUNTER — Encounter: Payer: Self-pay | Admitting: Gastroenterology

## 2021-03-16 ENCOUNTER — Encounter (HOSPITAL_BASED_OUTPATIENT_CLINIC_OR_DEPARTMENT_OTHER): Payer: Self-pay

## 2021-03-16 ENCOUNTER — Emergency Department (HOSPITAL_BASED_OUTPATIENT_CLINIC_OR_DEPARTMENT_OTHER): Payer: BC Managed Care – PPO | Admitting: Radiology

## 2021-03-16 ENCOUNTER — Other Ambulatory Visit: Payer: Self-pay

## 2021-03-16 ENCOUNTER — Emergency Department (HOSPITAL_BASED_OUTPATIENT_CLINIC_OR_DEPARTMENT_OTHER)
Admission: EM | Admit: 2021-03-16 | Discharge: 2021-03-16 | Disposition: A | Discharge status: Home / Self Care | Payer: BC Managed Care – PPO | Attending: Emergency Medicine | Admitting: Emergency Medicine

## 2021-03-16 DIAGNOSIS — J029 Acute pharyngitis, unspecified: Secondary | ICD-10-CM | POA: Diagnosis not present

## 2021-03-16 DIAGNOSIS — E039 Hypothyroidism, unspecified: Secondary | ICD-10-CM | POA: Diagnosis not present

## 2021-03-16 DIAGNOSIS — R059 Cough, unspecified: Secondary | ICD-10-CM | POA: Diagnosis present

## 2021-03-16 DIAGNOSIS — U071 COVID-19: Secondary | ICD-10-CM | POA: Insufficient documentation

## 2021-03-16 LAB — RESP PANEL BY RT-PCR (FLU A&B, COVID) ARPGX2
Influenza A by PCR: NEGATIVE
Influenza B by PCR: NEGATIVE
SARS Coronavirus 2 by RT PCR: POSITIVE — AB

## 2021-03-16 LAB — GROUP A STREP BY PCR: Group A Strep by PCR: NOT DETECTED

## 2021-03-16 NOTE — ED Notes (Signed)
EMT-P provided AVS using Teachback Method. Patient verbalizes understanding of Discharge Instructions. Opportunity for Questioning and Answers were provided by EMT-P. Patient Discharged from ED.  ? ?

## 2021-03-16 NOTE — ED Triage Notes (Signed)
Pt c/o sore throat, cough, nausea, and general malaise that started 4 days ago and gradually worsened.

## 2021-03-16 NOTE — ED Provider Notes (Signed)
[facility] EMERGENCY DEPT Provider Note   CSN: [id number] Arrival date & time: 03/16/21  2027     History  Chief Complaint  Patient presents with   Cough   Sore Throat    Jenna Baldwin is a 61 y.o. female.  Patient is a 61 year old female with history of hypothyroidism.  Patient presenting today with complaints of URI symptoms.  She describes sore throat, cough, congestion, nausea, and fatigue that has worsened over the past 4 days.  Patient's husband recently ill with similar symptoms.  Patient denies any chest pain or difficulty breathing.  She does report cough productive of green sputum  The history is provided by the patient.  Cough Cough characteristics:  Productive Sputum characteristics:  Green Severity:  Moderate Onset quality:  Gradual Duration:  3 days Timing:  Constant Progression:  Worsening Chronicity:  New Relieved by:  Nothing Worsened by:  Nothing Ineffective treatments:  None tried Sore Throat      Home Medications Prior to Admission medications   Medication Sig Start Date End Date Taking? Authorizing Provider  Bioflavonoid Products (VITAMIN C/BIOFLAVONOIDS PO) Take 1 capsule by mouth 2 (two) times daily.    [provider]  Cholecalciferol (BIO-D-MULSION FORTE) 50 MCG/0.04ML LIQD Take 1 drop by mouth daily at 6 (six) AM.    [provider]  COD LIVER OIL/VITAMINS A & D PO Take 1 Dose by mouth 2 (two) times daily.    [provider]  COLLAGEN PO Take 1 Dose by mouth daily at 6 (six) AM.    [provider]  magnesium oxide (MAG-OX) 400 MG tablet Take 1 tablet by mouth at bedtime as needed.    [provider]  Multiple Vitamins-Minerals (ICAPS AREDS 2 PO) Take 1 capsule by mouth 2 (two) times daily.    [provider]  TIROSINT 50 MCG CAPS Take 1 capsule by mouth daily. 08/04/20   [provider]      Allergies    Gluten meal, Nitrofurantoin, Fluconazole, Sulfa antibiotics,  Macrodantin [nitrofurantoin macrocrystal], and Levothyroxine sodium    Review of Systems   Review of Systems  Respiratory:  Positive for cough.   All other systems reviewed and are negative.  Physical Exam Updated Vital Signs BP (!) 154/82 (BP Location: Right Arm)    Pulse (!) 59    Temp 98 F (36.7 C) (Oral)    Resp 20    Ht 5\' 2"  (1.575 m)    Wt 65.8 kg    SpO2 100%    BMI 26.52 kg/m  Physical Exam Vitals and nursing note reviewed.  Constitutional:      General: She is not in acute distress.    Appearance: She is well-developed. She is not diaphoretic.  HENT:     Head: Normocephalic and atraumatic.     Nose: No congestion or rhinorrhea.     Mouth/Throat:     Mouth: Mucous membranes are moist.  Cardiovascular:     Rate and Rhythm: Normal rate and regular rhythm.     Heart sounds: No murmur heard.   No friction rub. No gallop.  Pulmonary:     Effort: Pulmonary effort is normal. No respiratory distress.     Breath sounds: Normal breath sounds. No wheezing.  Abdominal:     General: Bowel sounds are normal. There is no distension.     Palpations: Abdomen is soft.     Tenderness: There is no abdominal tenderness.  Musculoskeletal:  General: Normal range of motion.     Cervical back: Normal range of motion and neck supple.  Skin:    General: Skin is warm and dry.  Neurological:     General: No focal deficit present.     Mental Status: She is alert and oriented to person, place, and time.    ED Results / Procedures / Treatments   Labs (all labs ordered are listed, but only abnormal results are displayed) Labs Reviewed  RESP PANEL BY RT-PCR (FLU A&B, COVID) ARPGX2 - Abnormal; Notable for the following components:      Result Value   SARS Coronavirus 2 by RT PCR POSITIVE (*)    All other components within normal limits  GROUP A STREP BY PCR    EKG None  Radiology DG Chest 2 View  Result Date: 03/16/2021 CLINICAL DATA:  Sore throat with cough and nausea. EXAM:  CHEST - 2 VIEW COMPARISON:  None. FINDINGS: The heart size and mediastinal contours are within normal limits. Low lung volumes are seen. Both lungs are clear. The visualized skeletal structures are unremarkable. IMPRESSION: No active cardiopulmonary disease. Electronically Signed   By: Virgina Norfolk M.D.   On: 03/16/2021 20:53    Procedures Procedures    Medications Ordered in ED Medications - No data to display  ED Course/ Medical Decision Making/ A&P  Patient presenting with URI symptoms as described in the HPI.  Her COVID test is positive.  Patient with no hypoxia, normal vital signs, and appears clinically well.  She will be discharged with over-the-counter medications and as needed return.  Jenna Baldwin was evaluated in Emergency Department on 03/16/2021 for the symptoms described in the history of present illness. She was evaluated in the context of the global COVID-19 pandemic, which necessitated consideration that the patient might be at risk for infection with the SARS-CoV-2 virus that causes COVID-19. Institutional protocols and algorithms that pertain to the evaluation of patients at risk for COVID-19 are in a state of rapid change based on information released by regulatory bodies including the CDC and federal and state organizations. These policies and algorithms were followed during the patient's care in the ED.   Final Clinical Impression(s) / ED Diagnoses Final diagnoses:  None    Rx / DC Orders ED Discharge Orders     None         Veryl Speak, MD 03/16/21 2337

## 2021-03-16 NOTE — Discharge Instructions (Signed)
Take Tylenol 1000 mg rotated with Motrin 600 mg every 4 hours as needed for pain or fever.  Drink plenty of fluids and get plenty of rest.  You can take over-the-counter medications as needed for symptom relief.  Isolate at home for the next 5 days.  Return to the emergency department if you develop severe chest pain, difficulty breathing, or other new and concerning symptoms.

## 2021-03-16 NOTE — ED Notes (Signed)
ED Provider at bedside. 

## 2022-09-05 IMAGING — DX DG CHEST 2V
2 series · 2 of 2 positions shown · non-contrast
Comparison: None.

CLINICAL DATA: Sore throat with cough and nausea.

EXAM:
CHEST - 2 VIEW

[chest pa]
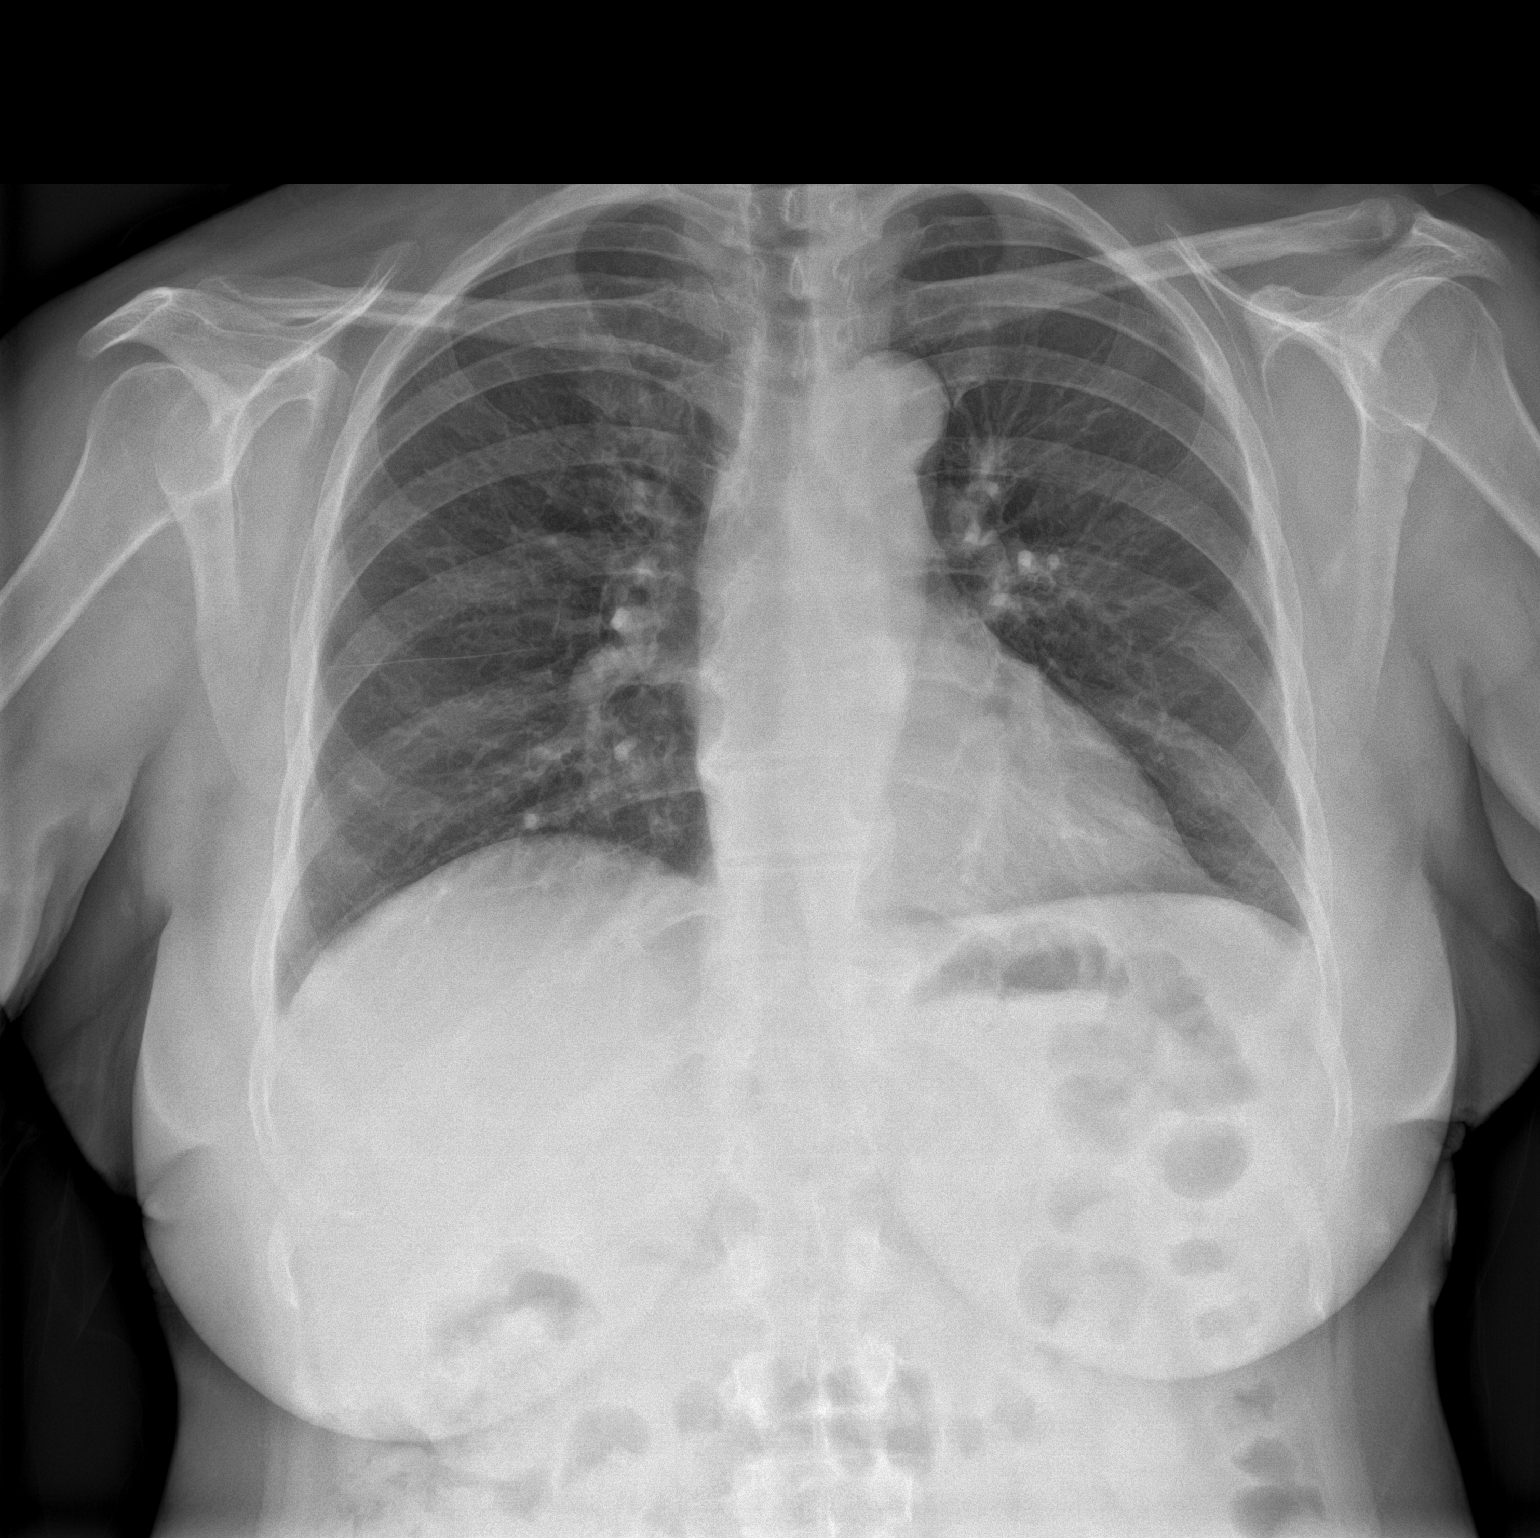

[chest lat]
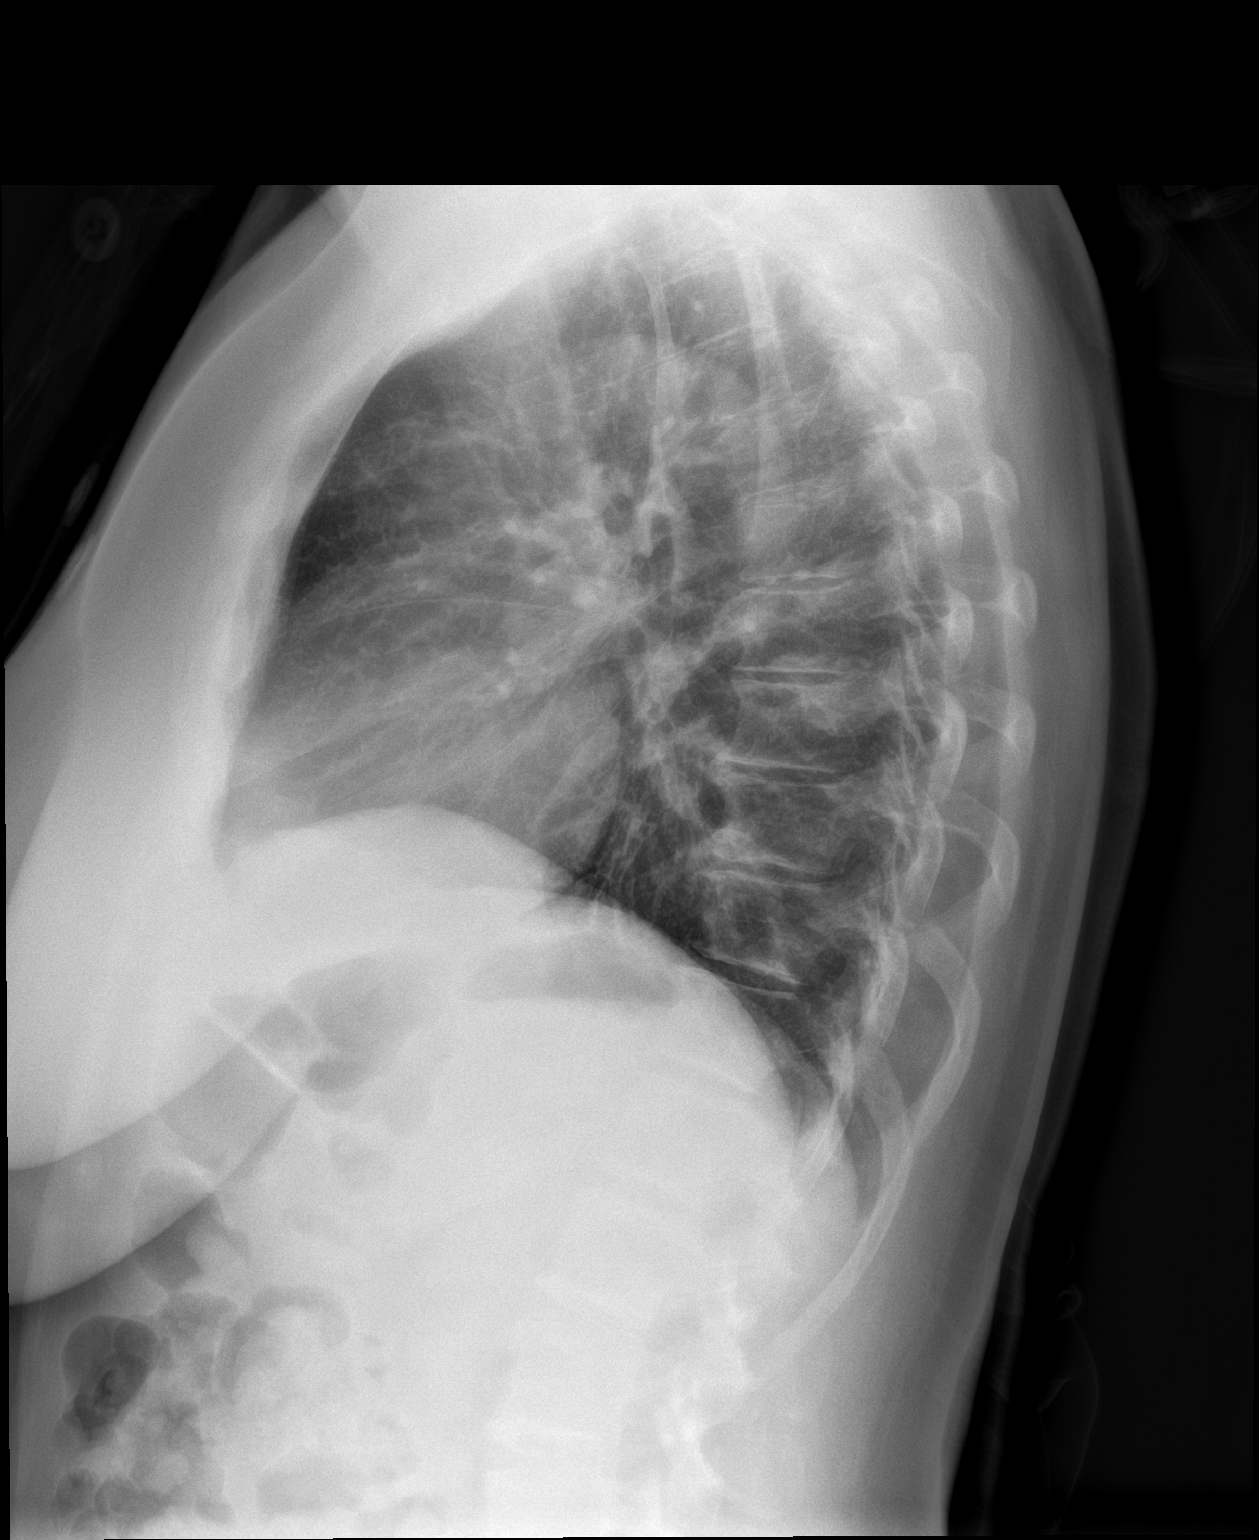

[2 of 2 positions shown; findings below may reference images not displayed]

FINDINGS: The heart size and mediastinal contours are within normal limits.
Low lung volumes are seen. Both lungs are clear. The visualized
skeletal structures are unremarkable.
IMPRESSION: No active cardiopulmonary disease.

## 2024-01-13 ENCOUNTER — Emergency Department (HOSPITAL_COMMUNITY)
Admission: EM | Admit: 2024-01-13 | Discharge: 2024-01-13 | Disposition: A | Discharge status: Home / Self Care | Attending: Emergency Medicine | Admitting: Emergency Medicine

## 2024-01-13 ENCOUNTER — Encounter (HOSPITAL_COMMUNITY): Payer: Self-pay

## 2024-01-13 ENCOUNTER — Emergency Department (HOSPITAL_COMMUNITY)

## 2024-01-13 ENCOUNTER — Other Ambulatory Visit: Payer: Self-pay

## 2024-01-13 DIAGNOSIS — J45909 Unspecified asthma, uncomplicated: Secondary | ICD-10-CM | POA: Diagnosis not present

## 2024-01-13 DIAGNOSIS — T7840XA Allergy, unspecified, initial encounter: Secondary | ICD-10-CM | POA: Diagnosis not present

## 2024-01-13 DIAGNOSIS — D72829 Elevated white blood cell count, unspecified: Secondary | ICD-10-CM | POA: Insufficient documentation

## 2024-01-13 DIAGNOSIS — M545 Low back pain, unspecified: Secondary | ICD-10-CM | POA: Diagnosis not present

## 2024-01-13 DIAGNOSIS — R10A2 Flank pain, left side: Secondary | ICD-10-CM | POA: Diagnosis present

## 2024-01-13 LAB — COMPREHENSIVE METABOLIC PANEL WITH GFR
ALT: 23 U/L (ref 0–44)
AST: 30 U/L (ref 15–41)
Albumin: 4.3 g/dL (ref 3.5–5.0)
Alkaline Phosphatase: 81 U/L (ref 38–126)
Anion gap: 11 (ref 5–15)
BUN: 15 mg/dL (ref 8–23)
CO2: 25 mmol/L (ref 22–32)
Calcium: 9.6 mg/dL (ref 8.9–10.3)
Chloride: 100 mmol/L (ref 98–111)
Creatinine, Ser: 0.88 mg/dL (ref 0.44–1.00)
GFR, Estimated: >60 mL/min (ref >60)
Glucose, Bld: 103 mg/dL — ABNORMAL HIGH (ref 70–99)
Potassium: 3.6 mmol/L (ref 3.5–5.1)
Sodium: 136 mmol/L (ref 135–145)
Total Bilirubin: 0.5 mg/dL (ref 0.0–1.2)
Total Protein: 7.5 g/dL (ref 6.5–8.1)

## 2024-01-13 LAB — CBC WITH DIFFERENTIAL/PLATELET
Abs Immature Granulocytes: 0.02 K/uL (ref 0.00–0.07)
Basophils Absolute: 0 K/uL (ref 0.0–0.1)
Basophils Relative: 0 %
Eosinophils Absolute: 0 K/uL (ref 0.0–0.5)
Eosinophils Relative: 0 %
HCT: 43.9 % (ref 36.0–46.0)
Hemoglobin: 14.5 g/dL (ref 12.0–15.0)
Immature Granulocytes: 0 %
Lymphocytes Relative: 3 %
Lymphs Abs: 0.3 K/uL — ABNORMAL LOW (ref 0.7–4.0)
MCH: 28 pg (ref 26.0–34.0)
MCHC: 33 g/dL (ref 30.0–36.0)
MCV: 84.9 fL (ref 80.0–100.0)
Monocytes Absolute: 0.3 K/uL (ref 0.1–1.0)
Monocytes Relative: 3 %
Neutro Abs: 10.2 K/uL — ABNORMAL HIGH (ref 1.7–7.7)
Neutrophils Relative %: 94 %
Platelets: 230 K/uL (ref 150–400)
RBC: 5.17 MIL/uL — ABNORMAL HIGH (ref 3.87–5.11)
RDW: 13.3 % (ref 11.5–15.5)
WBC: 10.9 K/uL — ABNORMAL HIGH (ref 4.0–10.5)
nRBC: 0 % (ref 0.0–0.2)

## 2024-01-13 LAB — URINALYSIS, ROUTINE W REFLEX MICROSCOPIC
Bilirubin Urine: NEGATIVE
Glucose, UA: NEGATIVE mg/dL
Hgb urine dipstick: NEGATIVE
Ketones, ur: NEGATIVE mg/dL
Leukocytes,Ua: NEGATIVE
Nitrite: NEGATIVE
Protein, ur: NEGATIVE mg/dL
Specific Gravity, Urine: 1.012 (ref 1.005–1.030)
pH: 6 (ref 5.0–8.0)

## 2024-01-13 MED ORDER — METHOCARBAMOL 500 MG PO TABS
500.0000 mg | ORAL_TABLET | Freq: Once | ORAL | Status: AC
  Administered 2024-01-13: 500 mg via ORAL
  Filled 2024-01-13: qty 1

## 2024-01-13 MED ORDER — KETOROLAC TROMETHAMINE 15 MG/ML IJ SOLN
15.0000 mg | Freq: Once | INTRAMUSCULAR | Status: AC
  Administered 2024-01-13: 15 mg via INTRAVENOUS
  Filled 2024-01-13: qty 1

## 2024-01-13 MED ORDER — EPINEPHRINE 0.3 MG/0.3ML IJ SOAJ: 0.3000 mg | INTRAMUSCULAR | 0 refills | Status: AC | PRN

## 2024-01-13 MED ORDER — ACETAMINOPHEN 500 MG PO TABS
1000.0000 mg | ORAL_TABLET | Freq: Once | ORAL | Status: AC
  Administered 2024-01-13: 1000 mg via ORAL
  Filled 2024-01-13: qty 2

## 2024-01-13 MED ORDER — METHOCARBAMOL 500 MG PO TABS: 500.0000 mg | ORAL_TABLET | Freq: Three times a day (TID) | ORAL | 0 refills | Status: AC | PRN

## 2024-01-13 NOTE — Medical Student Note (Incomplete)
 [facility]-EMERGENCY DEPT Provider Student Note For educational purposes for Medical, PA and NP students only and not part of the legal medical record.   CSN: [id number] Arrival date & time: 01/13/24  0241      History   Chief Complaint Chief Complaint  Patient presents with  . Flank Pain    HPI Jenna Baldwin is a 63 y.o. female presents to the ED after developing hives following bactrim use prescribed for UTI. She reports the rash started after taking the abx and resolved with benadryl and is now requesting an alternative abx. Pt denies fever, body ache, sore throat, painful blisters, swelling, CP, SOB.   Flank Pain    Past Medical History:  Diagnosis Date  . Arthritis    RIGHT knee and thumb  . Asthma    hx of  . Blood transfusion without reported diagnosis 1986  . Seasonal allergies   . Thyroid disease    on meds    There are no active problems to display for this patient.   Past Surgical History:  Procedure Laterality Date  . skin spot removed on scalp -unknown status of cancerous cells  03/03/1968  . BREAST CYST ASPIRATION Right 05/17/2014  . CESAREAN SECTION     1986, 1992  . TUBAL LIGATION     occured with last C-section 1992    OB History   No obstetric history on file.      Home Medications    Prior to Admission medications   Medication Sig Start Date End Date Taking? Authorizing Provider  Bioflavonoid Products (VITAMIN C/BIOFLAVONOIDS PO) Take 1 capsule by mouth 2 (two) times daily.    [provider]  Cholecalciferol (BIO-D-MULSION FORTE) 50 MCG/0.04ML LIQD Take 1 drop by mouth daily at 6 (six) AM.    [provider]  COD LIVER OIL/VITAMINS A & D PO Take 1 Dose by mouth 2 (two) times daily.    [provider]  COLLAGEN PO Take 1 Dose by mouth daily at 6 (six) AM.    [provider]  magnesium oxide (MAG-OX) 400 MG tablet Take 1 tablet by mouth at bedtime as needed.    [provider]  Multiple  Vitamins-Minerals (ICAPS AREDS 2 PO) Take 1 capsule by mouth 2 (two) times daily.    [provider]  TIROSINT 50 MCG CAPS Take 1 capsule by mouth daily. 08/04/20   [provider]    Family History Family History  Problem Relation Age of Onset  . Cancer Mother   . Breast cancer Mother 50  . Colon polyps Father 21  . Cancer Paternal Grandmother   . Colon polyps Paternal Grandfather 25  . Colon cancer Paternal Grandfather 56  . Cancer Paternal Grandfather   . Esophageal cancer Neg Hx   . Rectal cancer Neg Hx   . Stomach cancer Neg Hx     Social History Social History   Tobacco Use  . Smoking status: Former    Current packs/day: 0.00    Types: Cigarettes    Start date: 07/26/1978    Quit date: 07/26/1982    Years since quitting: 41.4  . Smokeless tobacco: Never  Vaping Use  . Vaping status: Never Used  Substance Use Topics  . Alcohol use: No    Comment: special occasions  . Drug use: No     Allergies   Gluten meal, Nitrofurantoin, Fluconazole, Sulfa antibiotics, Macrodantin [nitrofurantoin macrocrystal], and Levothyroxine sodium   Review of Systems Review of Systems  Genitourinary:  Positive for flank pain.     Physical Exam Updated Vital Signs BP 132/77 (BP Location: Right Arm)   Pulse 78   Temp (!) 97.5 F (36.4 C) (Oral)   Resp 16   Ht 5' 2 (1.575 m)   Wt 64 kg   SpO2 99%   BMI 25.79 kg/m   Physical Exam   ED Treatments / Results  Labs (all labs ordered are listed, but only abnormal results are displayed) Labs Reviewed  URINALYSIS, ROUTINE W REFLEX MICROSCOPIC    EKG  Radiology No results found.  Procedures Procedures (including critical care time)  Medications Ordered in ED Medications - No data to display   Initial Impression / Assessment and Plan / ED Course  I have reviewed the triage vital signs and the nursing notes.  Pertinent labs & imaging results that were available during my care of the patient were  reviewed by me and considered in my medical decision making (see chart for details).     ***  Final Clinical Impressions(s) / ED Diagnoses   Final diagnoses:  None    New Prescriptions New Prescriptions   No medications on file

## 2024-01-13 NOTE — Discharge Instructions (Addendum)

## 2024-01-13 NOTE — ED Notes (Signed)
 Patient taken to CT.

## 2024-01-13 NOTE — ED Provider Notes (Signed)
 [facility] EMERGENCY DEPARTMENT AT [facility] Provider Note  CSN: [id number] Arrival date & time: 01/13/24 0241  Chief Complaint(s) Flank Pain and Allergic Reaction (To bactrim)  HPI Jenna Baldwin is a 63 y.o. female    With a past medical history listed below who presents to the emergency department for left flank pain felt for the past several days with urgency.  She was diagnosed with a urinary tract infection by her PCP and placed on Bactrim yesterday.  She took the first dose and shortly after developed hives with pruritus of the inner ear and oropharynx.  She denied any associated shortness of breath.  No swelling.  No nausea or vomiting.  No headache.  No abdominal pain.  Patient took Benadryl and rash and urticaria resolved.  She presented due to worsening left-sided flank pain since waking up.  The history is provided by the patient.    Past Medical History Past Medical History:  Diagnosis Date   Arthritis    RIGHT knee and thumb   Asthma    hx of   Blood transfusion without reported diagnosis 1986   Seasonal allergies    Thyroid disease    on meds   There are no active problems to display for this patient.  Home Medication(s) Prior to Admission medications   Medication Sig Start Date End Date Taking? Authorizing Provider  acetaminophen (TYLENOL) 500 MG tablet Take 500 mg by mouth every 6 (six) hours as needed.   Yes [provider]  EPINEPHrine 0.3 mg/0.3 mL IJ SOAJ injection Inject 0.3 mg into the muscle as needed for anaphylaxis. 01/13/24  Yes Tipton Ballow, Raynell Moder, MD  methocarbamol (ROBAXIN) 500 MG tablet Take 1-2 tablets (500-1,000 mg total) by mouth every 8 (eight) hours as needed for muscle spasms. 01/13/24  Yes Alek Poncedeleon, Raynell Moder, MD  TIROSINT 50 MCG CAPS Take 1 capsule by mouth daily. 08/04/20  Yes [provider]                                                                                                                                     Allergies Gluten meal, Nitrofurantoin, Fluconazole, Sulfa antibiotics, Macrodantin [nitrofurantoin macrocrystal], and Levothyroxine sodium  Review of Systems Review of Systems As noted in HPI  Physical Exam Vital Signs  I have reviewed the triage vital signs BP 111/62 (BP Location: Left Arm)   Pulse 69   Temp 98.1 F (36.7 C) (Oral)   Resp 16   Ht 5' 2 (1.575 m)   Wt 64 kg   SpO2 98%   BMI 25.79 kg/m   Physical Exam Vitals reviewed.  Constitutional:      General: She is not in acute distress.    Appearance: She is well-developed. She is not diaphoretic.  HENT:     Head: Normocephalic and atraumatic.     Nose: Nose normal.  Eyes:     General: No scleral  icterus.       Right eye: No discharge.        Left eye: No discharge.     Conjunctiva/sclera: Conjunctivae normal.     Pupils: Pupils are equal, round, and reactive to light.  Cardiovascular:     Rate and Rhythm: Normal rate and regular rhythm.     Heart sounds: No murmur heard.    No friction rub. No gallop.  Pulmonary:     Effort: Pulmonary effort is normal. No respiratory distress.     Breath sounds: Normal breath sounds. No stridor. No rales.  Abdominal:     General: There is no distension.     Palpations: Abdomen is soft.     Tenderness: There is no abdominal tenderness. There is left CVA tenderness.  Musculoskeletal:     Cervical back: Normal range of motion and neck supple.     Lumbar back: Tenderness present.       Back:  Skin:    General: Skin is warm and dry.     Findings: No erythema or rash.  Neurological:     Mental Status: She is alert and oriented to person, place, and time.     ED Results and Treatments Labs (all labs ordered are listed, but only abnormal results are displayed) Labs Reviewed  CBC WITH DIFFERENTIAL/PLATELET - Abnormal; Notable for the following components:      Result Value   WBC 10.9 (*)    RBC 5.17 (*)    Neutro Abs 10.2 (*)    Lymphs Abs 0.3 (*)     All other components within normal limits  COMPREHENSIVE METABOLIC PANEL WITH GFR - Abnormal; Notable for the following components:   Glucose, Bld 103 (*)    All other components within normal limits  URINALYSIS, ROUTINE W REFLEX MICROSCOPIC                                                                                                                         EKG  EKG Interpretation Date/Time:    Ventricular Rate:    PR Interval:    QRS Duration:    QT Interval:    QTC Calculation:   R Axis:      Text Interpretation:         Radiology No results found.   Medications Ordered in ED Medications  ketorolac (TORADOL) 15 MG/ML injection 15 mg (15 mg Intravenous Given 01/13/24 0403)  acetaminophen (TYLENOL) tablet 1,000 mg (1,000 mg Oral Given 01/13/24 0631)  methocarbamol (ROBAXIN) tablet 500 mg (500 mg Oral Given by Other 01/13/24 9367)   Procedures Procedures  (including critical care time) Medical Decision Making / ED Course   Medical Decision Making Amount and/or Complexity of Data Reviewed Labs: ordered. Decision-making details documented in ED Course. Radiology: ordered and independent interpretation performed. Decision-making details documented in ED Course.  Risk OTC drugs. Prescription drug management.    Allergic reaction.  Likely related to Bactrim use. No respiratory, GI, or neurologic  symptoms to suggest anaphylaxis. Patient has already taken benadryl prior to arrival and symptoms have resolved.  Will continue to monitor for recurrence.  If negative, will prescribe patient EpiPen to have on hand.  Do not feel that steroids are not necessary at this time.  Bactrim added to patient's allergy list  Left flank pain.  Reported urinary tract infection.  Given reported history of urinary tract infection, will obtain labs, UA and CT stone study to rule out renal stones.  Also considering muscular strain/spasm.  CBC with mild leukocytosis.  No anemia.  CMP  without significant electrolyte derangements or renal sufficiency.  No evidence of bili obstruction.  UA completely negative to have extremely low suspicion for pyelonephritis.  CT without renal stones.  Did show cholelithiasis which does not clinically correlate with patient's symptoms.  She does have mild left-sided diverticulosis without diverticulitis.  No other acute findings.  I do not feel patient requires continued use of antibiotics at this time.  Will treat for muscular strain.     Final Clinical Impression(s) / ED Diagnoses Final diagnoses:  Left flank pain  Allergic reaction, initial encounter   The patient appears reasonably screened and/or stabilized for discharge and I doubt any other medical condition or other [facility] requiring further screening, evaluation, or treatment in the ED at this time. I have discussed the findings, Dx and Tx plan with the patient/family who expressed understanding and agree(s) with the plan. Discharge instructions discussed at length. The patient/family was given strict return precautions who verbalized understanding of the instructions. No further questions at time of discharge.  Disposition: Discharge  Condition: Good  ED Discharge Orders          Ordered    methocarbamol (ROBAXIN) 500 MG tablet  Every 8 hours PRN        01/13/24 0704    EPINEPHrine 0.3 mg/0.3 mL IJ SOAJ injection  As needed        01/13/24 9295             Follow Up: Burney Darice CROME, MD [address] [place] [address] [phone]  Call  to schedule an appointment for close follow up    This chart was dictated using voice recognition software.  Despite best efforts to proofread,  errors can occur which can change the documentation meaning.    Trine Raynell Moder, MD 01/14/24 [phone]

## 2024-01-13 NOTE — ED Triage Notes (Signed)
 Pt POV with husband d/t Left flank pain with dysuria.  Pt took a dose of Bactrim prescribed by PCP but broke out in hives (took at 1900 hives at 2100 and subsided p Benadryl 0100).  Pt is still experiencing a lot of pain and needs a new ABX.

## 2024-04-08 ENCOUNTER — Ambulatory Visit: Admitting: Physical Therapy

## 2024-06-10 ENCOUNTER — Ambulatory Visit: Admitting: Physical Therapy
